# Patient Record
Sex: Female | Born: 1990 | Race: White | Hispanic: No | Smoking: Former smoker
Health system: Southern US, Community
[De-identification: ages and names within clinical notes are randomized; demographics above are authoritative.]

## PROBLEM LIST (undated history)

## (undated) ENCOUNTER — Inpatient Hospital Stay (HOSPITAL_COMMUNITY): Payer: Self-pay

## (undated) DIAGNOSIS — F319 Bipolar disorder, unspecified: Secondary | ICD-10-CM

## (undated) DIAGNOSIS — M419 Scoliosis, unspecified: Secondary | ICD-10-CM

## (undated) DIAGNOSIS — Z8619 Personal history of other infectious and parasitic diseases: Secondary | ICD-10-CM

## (undated) HISTORY — DX: Personal history of other infectious and parasitic diseases: Z86.19

## (undated) HISTORY — PX: FOOT SURGERY: SHX648

---
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
## 2010-06-26 LAB — CBC
HCT: 41.7 % (ref 36.0–46.0)
MCHC: 33.7 g/dL (ref 30.0–36.0)
MCV: 90.6 fL (ref 78.0–100.0)
Platelets: 253 10*3/uL (ref 150–400)
RBC: 4.6 MIL/uL (ref 3.87–5.11)
RDW: 13.3 % (ref 11.5–15.5)

## 2010-06-26 LAB — URINALYSIS, ROUTINE W REFLEX MICROSCOPIC
Bilirubin Urine: NEGATIVE
Ketones, ur: NEGATIVE mg/dL
Protein, ur: NEGATIVE mg/dL
Urobilinogen, UA: 0.2 mg/dL (ref 0.0–1.0)

## 2010-06-26 LAB — WET PREP, GENITAL: Trich, Wet Prep: NONE SEEN

## 2010-06-26 LAB — URINE MICROSCOPIC-ADD ON

## 2010-09-03 ENCOUNTER — Emergency Department (HOSPITAL_COMMUNITY): Payer: Self-pay

## 2010-09-03 ENCOUNTER — Emergency Department (HOSPITAL_COMMUNITY)
Admission: EM | Admit: 2010-09-03 | Discharge: 2010-09-03 | Disposition: A | Discharge status: Still In Hospital | Payer: Self-pay | Attending: Emergency Medicine | Admitting: Emergency Medicine

## 2010-09-03 DIAGNOSIS — F909 Attention-deficit hyperactivity disorder, unspecified type: Secondary | ICD-10-CM | POA: Insufficient documentation

## 2010-09-03 DIAGNOSIS — F319 Bipolar disorder, unspecified: Secondary | ICD-10-CM | POA: Insufficient documentation

## 2010-09-03 DIAGNOSIS — N898 Other specified noninflammatory disorders of vagina: Secondary | ICD-10-CM | POA: Insufficient documentation

## 2010-09-03 DIAGNOSIS — R109 Unspecified abdominal pain: Secondary | ICD-10-CM | POA: Insufficient documentation

## 2010-09-03 DIAGNOSIS — O021 Missed abortion: Secondary | ICD-10-CM | POA: Insufficient documentation

## 2010-09-03 LAB — CBC
Hemoglobin: 14.4 g/dL (ref 12.0–15.0)
MCHC: 35.9 g/dL (ref 30.0–36.0)
MCV: 86.6 fL (ref 78.0–100.0)
Platelets: 275 10*3/uL (ref 150–400)
RBC: 4.63 MIL/uL (ref 3.87–5.11)
WBC: 16.2 10*3/uL — ABNORMAL HIGH (ref 4.0–10.5)

## 2010-09-03 LAB — DIFFERENTIAL
Basophils Relative: 0 % (ref 0–1)
Eosinophils Absolute: 0.1 10*3/uL (ref 0.0–0.7)
Eosinophils Relative: 1 % (ref 0–5)
Monocytes Relative: 6 % (ref 3–12)

## 2010-09-03 LAB — ABO/RH: ABO/RH(D): O POS

## 2010-09-03 LAB — HCG, QUANTITATIVE, PREGNANCY: hCG, Beta Chain, Quant, S: 4072 m[IU]/mL — ABNORMAL HIGH (ref <5)

## 2010-09-04 ENCOUNTER — Inpatient Hospital Stay (HOSPITAL_COMMUNITY): Payer: Self-pay

## 2010-09-04 ENCOUNTER — Inpatient Hospital Stay (HOSPITAL_COMMUNITY)
Admission: AD | Admit: 2010-09-04 | Discharge: 2010-09-04 | Disposition: A | Discharge status: Home / Self Care | Payer: Self-pay | Source: Other Acute Inpatient Hospital | Attending: Obstetrics & Gynecology | Admitting: Obstetrics & Gynecology

## 2010-09-04 DIAGNOSIS — O039 Complete or unspecified spontaneous abortion without complication: Secondary | ICD-10-CM

## 2010-09-04 LAB — CBC
HCT: 36.9 % (ref 36.0–46.0)
Hemoglobin: 12.9 g/dL (ref 12.0–15.0)
MCH: 30.6 pg (ref 26.0–34.0)
MCHC: 35 g/dL (ref 30.0–36.0)
MCV: 87.4 fL (ref 78.0–100.0)
Platelets: 269 K/uL (ref 150–400)
RBC: 4.22 MIL/uL (ref 3.87–5.11)
RDW: 12.4 % (ref 11.5–15.5)
WBC: 15.9 K/uL — ABNORMAL HIGH (ref 4.0–10.5)

## 2011-01-15 ENCOUNTER — Inpatient Hospital Stay (HOSPITAL_COMMUNITY)
Admission: AD | Admit: 2011-01-15 | Discharge: 2011-01-15 | Disposition: A | Discharge status: Home / Self Care | Payer: Medicaid Other | Source: Ambulatory Visit | Attending: Obstetrics and Gynecology | Admitting: Obstetrics and Gynecology

## 2011-01-15 ENCOUNTER — Encounter (HOSPITAL_COMMUNITY): Payer: Self-pay | Admitting: *Deleted

## 2011-01-15 ENCOUNTER — Inpatient Hospital Stay (HOSPITAL_COMMUNITY): Payer: Medicaid Other

## 2011-01-15 DIAGNOSIS — O210 Mild hyperemesis gravidarum: Secondary | ICD-10-CM

## 2011-01-15 DIAGNOSIS — O211 Hyperemesis gravidarum with metabolic disturbance: Secondary | ICD-10-CM | POA: Insufficient documentation

## 2011-01-15 DIAGNOSIS — E860 Dehydration: Secondary | ICD-10-CM | POA: Insufficient documentation

## 2011-01-15 HISTORY — DX: Scoliosis, unspecified: M41.9

## 2011-01-15 HISTORY — DX: Bipolar disorder, unspecified: F31.9

## 2011-01-15 LAB — URINALYSIS, ROUTINE W REFLEX MICROSCOPIC
Ketones, ur: >80 mg/dL — AB
Protein, ur: NEGATIVE mg/dL

## 2011-01-15 MED ORDER — ONDANSETRON 4 MG PO TBDP
4.0000 mg | ORAL_TABLET | Freq: Once | ORAL | Status: AC
  Administered 2011-01-15: 4 mg via ORAL
  Filled 2011-01-15: qty 1

## 2011-01-15 MED ORDER — ONDANSETRON 8 MG PO TBDP: 8.0000 mg | ORAL_TABLET | Freq: Three times a day (TID) | ORAL | Status: AC | PRN

## 2011-01-15 MED ORDER — LACTATED RINGERS IV BOLUS (SEPSIS)
1000.0000 mL | Freq: Once | INTRAVENOUS | Status: AC
  Administered 2011-01-15: 1000 mL via INTRAVENOUS

## 2011-01-15 MED ORDER — LACTATED RINGERS IV BOLUS (SEPSIS): 1000.0000 mL | Freq: Once | INTRAVENOUS | Status: DC

## 2011-01-15 MED ORDER — PROMETHAZINE HCL 25 MG/ML IJ SOLN
25.0000 mg | INTRAVENOUS | Status: DC
  Administered 2011-01-15: 25 mg via INTRAVENOUS
  Filled 2011-01-15: qty 1

## 2011-01-15 MED ORDER — PROMETHAZINE HCL 25 MG PO TABS: 25.0000 mg | ORAL_TABLET | Freq: Four times a day (QID) | ORAL | Status: DC | PRN

## 2011-01-15 NOTE — ED Provider Notes (Signed)
History     Chief Complaint  Patient presents with  . Possible Pregnancy   HPI  Pt is pregnant and is complaining of vaginal spotting 9/6.  She presents with vomiting intermittently more so today.  She has not had anything to eat or drink today.   Care assumed from Brooke Marquez, CNM  Past Medical History  Diagnosis Date  . Bipolar affective   . Scoliosis     Past Surgical History  Procedure Date  . Foot surgery     No family history on file.  History  Substance Use Topics  . Smoking status: Former Smoker -- 1.0 packs/day    Quit date: 01/09/2011  . Smokeless tobacco: Former Neurosurgeon    Quit date: 01/09/2011  . Alcohol Use: No    Allergies:  Allergies  Allergen Reactions  . Codeine Hives    No prescriptions prior to admission    ROS Physical Exam   Blood pressure 123/64, pulse 56, temperature 98.5 F (36.9 C), temperature source Oral, resp. rate 16, height 5\' 3"  (1.6 m), weight 161 lb 12.8 oz (73.392 kg), last menstrual period 11/10/2010, SpO2 99.00%.  Physical Exam  MAU Course  Procedures Yolk sac: Present  Embryo: Present  Cardiac Activity: Present  Heart Rate: 106 bpm  CRL: 7 mm 6w 5d [place] EDC: 09/05/2011  Maternal uterus/Adnexae:  Small subchorionic hemorrhage.  No free pelvic fluid.  Right ovary normal size, 2.8 x 2.8 x 3.6 cm, containing a corpus  luteum.  Left ovary normal size and morphology, 2.3 x 2.1 x 1.7 cm.  No adnexal masses.  IMPRESSION:  Single live early intrauterine gestation measured at 6 weeks 5 days  EGA by crown-rump length.  Small subchorionic hemorrhage.  Original Report Authenticated By: Brooke Marquez, M.D.  Pt was given IVF for hydration with IV Phenergan and Zofran ODT- pt was able to tolerate PO fluids and crackers after treatment   Assessment and Plan  Hyperemesis in pregnancy with dehydration   Brooke Marquez 01/15/2011, 6:14 PM

## 2011-01-15 NOTE — Progress Notes (Signed)
Pt states has been vomiting intermittently, unable to hold food down today, but is tolerating fluids.  Pt is bipolar with no med tx, states has been more moody than usual during the pregnancy.  Desires PNC at [facility], closer to her home than [facility] where she has an appt next week.  Pt requesting u/s.  Denies vag bleeding or d/c, but c/o intermittent cramping.

## 2011-01-15 NOTE — ED Provider Notes (Signed)
Brooke Marquez [id number] @Unknown  Chief Complaint  Patient presents with  . Possible Pregnancy    SUBJECTIVE  HPI: Presents with 2-3 week history of nausea and vomiting. States she's retain nothing but sips of fluids for the last couple of days. Feels weak and a little dizzy when standing and walking. Last normal menstrual period was the end of July 2 weeks later she had slight bleeding for one to 2 days in the middle of August. She has some abdominal soreness associated with vomiting and some mild lower abdominal cramping discomfort. No vaginal bleeding.  Past Medical History  Diagnosis Date  . Bipolar affective   . Scoliosis    Ob Hx: SAB x 3  Past Surgical History  Procedure Date  . Foot surgery    History   Social History  . Marital Status: Married    Spouse Name: N/A    Number of Children: N/A  . Years of Education: N/A   Occupational History  . Not on file.   Social History Main Topics  . Smoking status: Former Smoker -- 1.0 packs/day    Quit date: 01/09/2011  . Smokeless tobacco: Former Neurosurgeon    Quit date: 01/09/2011  . Alcohol Use: No  . Drug Use: No  . Sexually Active: Yes   Other Topics Concern  . Not on file   Social History Narrative  . No narrative on file   No current facility-administered medications on file prior to encounter.   No current outpatient prescriptions on file prior to encounter.   Allergies  Allergen Reactions  . Codeine Hives    ROS: Pertinent items in HPI  OBJECTIVE  BP 123/64  Pulse 56  Temp(Src) 98.5 F (36.9 C) (Oral)  Resp 16  Ht 5\' 3"  (1.6 m)  Wt 73.392 kg (161 lb 12.8 oz)  BMI 28.66 kg/m2  SpO2 99%  LMP 11/10/2010   Physical Exam:  General: Looks fatigued. Skin: Sl dry Abd: Soft, NT Pelvic: declined/deferred Back: Neg CVAT Ext: No edema   MAU course: Felt better after IV rehydration and antiemetics. Able to retain crackers. Results for orders placed during the hospital encounter of 01/15/11 (from  the past 24 hour(s))  URINALYSIS, ROUTINE W REFLEX MICROSCOPIC     Status: Abnormal   Collection Time   01/15/11 10:25 AM      Component Value Range   Color, Urine YELLOW  YELLOW    Appearance CLEAR  CLEAR    Specific Gravity, Urine 1.025  1.005 - 1.030    pH 6.0  5.0 - 8.0    Glucose, UA NEGATIVE  NEGATIVE (mg/dL)   Hgb urine dipstick NEGATIVE  NEGATIVE    Bilirubin Urine SMALL (*) NEGATIVE    Ketones, ur >80 (*) NEGATIVE (mg/dL)   Protein, ur NEGATIVE  NEGATIVE (mg/dL)   Urobilinogen, UA 1.0  0.0 - 1.0 (mg/dL)   Nitrite NEGATIVE  NEGATIVE    Leukocytes, UA NEGATIVE  NEGATIVE   POCT PREGNANCY, URINE     Status: Normal   Collection Time   01/15/11 10:28 AM      Component Value Range   Preg Test, Ur POSITIVE     MAU course: Vomited several times after arrival in MAU. IVF and antiemetics given with improvement in sx. Retaining crackers ASSESSMENT  Nausea and vomiting of pregnancy Dehydration   PLAN Rx Phenergan and Zofran Care assumed by Henrietta Hoover, PA. Pt in Ultrasound Dept.

## 2011-01-15 NOTE — Progress Notes (Signed)
Pt states she has had a POS HPT. Has had some nausea and vomiting for several weeks, was bad 10-10. Had irregular periods in July and the August period was early. Wants to know how far she is. Pt states she has a history of three miscarriages and is concerned. Has her first appointment next week.

## 2011-01-26 NOTE — ED Provider Notes (Signed)
Chart reviewed and agree with management and plan.  

## 2011-02-03 LAB — OB RESULTS CONSOLE ABO/RH: RH Type: POSITIVE

## 2011-02-03 LAB — OB RESULTS CONSOLE RPR: RPR: NONREACTIVE

## 2011-02-03 LAB — OB RESULTS CONSOLE GC/CHLAMYDIA: Gonorrhea: NEGATIVE

## 2011-04-07 NOTE — L&D Delivery Note (Signed)
Delivery Note At 8:16 PM a viable female was delivered via  (Presentation:LOA VTX ;  ).  APGAR:8/9 , ; weight .   Placenta status:INTACT , .  Cord:3 VESSEL  with the following complications:NONE .  Cord pH: NA  Anesthesia: Epidural  Episiotomy: NONE Lacerations: FIRST DEGREE Suture Repair: 2.0 chromic Est. Blood Loss (mL): 300 CC  Mom to postpartum.  Baby to nursery-stable.  [name] S 09/10/2011, 8:29 PM

## 2011-08-04 LAB — OB RESULTS CONSOLE GBS: GBS: NEGATIVE

## 2011-08-26 ENCOUNTER — Inpatient Hospital Stay (HOSPITAL_COMMUNITY)
Admission: AD | Admit: 2011-08-26 | Discharge: 2011-08-26 | Disposition: A | Discharge status: Home / Self Care | Payer: Medicaid Other | Source: Ambulatory Visit | Attending: Obstetrics and Gynecology | Admitting: Obstetrics and Gynecology

## 2011-08-26 ENCOUNTER — Encounter (HOSPITAL_COMMUNITY): Payer: Self-pay | Admitting: *Deleted

## 2011-08-26 DIAGNOSIS — O479 False labor, unspecified: Secondary | ICD-10-CM | POA: Insufficient documentation

## 2011-08-26 NOTE — MAU Note (Signed)
PT SAYS  HURT BAD  AT 0330.   VE IN OFFFICE - .5 CM.   DENIES HSV AND MRSA.

## 2011-09-07 ENCOUNTER — Encounter (HOSPITAL_COMMUNITY): Payer: Self-pay | Admitting: *Deleted

## 2011-09-07 ENCOUNTER — Telehealth (HOSPITAL_COMMUNITY): Payer: Self-pay | Admitting: *Deleted

## 2011-09-07 NOTE — Telephone Encounter (Signed)
Preadmission screen  

## 2011-09-09 ENCOUNTER — Inpatient Hospital Stay (HOSPITAL_COMMUNITY)
Admission: RE | Admit: 2011-09-09 | Discharge: 2011-09-12 | DRG: 775 | Disposition: A | Discharge status: Home / Self Care | Payer: Medicaid Other | Source: Ambulatory Visit | Attending: Obstetrics and Gynecology | Admitting: Obstetrics and Gynecology

## 2011-09-09 LAB — CBC
HCT: 36 % (ref 36.0–46.0)
Hemoglobin: 12.5 g/dL (ref 12.0–15.0)
MCH: 30.6 pg (ref 26.0–34.0)
MCHC: 34.7 g/dL (ref 30.0–36.0)
MCV: 88 fL (ref 78.0–100.0)
Platelets: 219 K/uL (ref 150–400)
RBC: 4.09 MIL/uL (ref 3.87–5.11)
RDW: 13.2 % (ref 11.5–15.5)
WBC: 12.3 K/uL — ABNORMAL HIGH (ref 4.0–10.5)

## 2011-09-09 MED ORDER — LIDOCAINE HCL (PF) 1 % IJ SOLN
30.0000 mL | INTRAMUSCULAR | Status: DC | PRN
  Administered 2011-09-10: 30 mL via SUBCUTANEOUS
  Filled 2011-09-09: qty 30

## 2011-09-09 MED ORDER — LACTATED RINGERS IV SOLN
INTRAVENOUS | Status: DC
  Administered 2011-09-09: 21:00:00 via INTRAVENOUS
  Administered 2011-09-10: 125 mL/h via INTRAVENOUS

## 2011-09-09 MED ORDER — TERBUTALINE SULFATE 1 MG/ML IJ SOLN: 0.2500 mg | Freq: Once | INTRAMUSCULAR | Status: AC | PRN

## 2011-09-09 MED ORDER — OXYTOCIN 20 UNITS IN LACTATED RINGERS INFUSION - SIMPLE
1.0000 m[IU]/min | INTRAVENOUS | Status: DC
  Administered 2011-09-10: 2 m[IU]/min via INTRAVENOUS
  Filled 2011-09-09: qty 1000

## 2011-09-09 MED ORDER — LACTATED RINGERS IV SOLN
500.0000 mL | INTRAVENOUS | Status: DC | PRN
  Administered 2011-09-10 (×2): 500 mL via INTRAVENOUS

## 2011-09-09 MED ORDER — CITRIC ACID-SODIUM CITRATE 334-500 MG/5ML PO SOLN: 30.0000 mL | ORAL | Status: DC | PRN

## 2011-09-09 MED ORDER — MISOPROSTOL 25 MCG QUARTER TABLET
25.0000 ug | ORAL_TABLET | ORAL | Status: AC | PRN
  Administered 2011-09-09 – 2011-09-10 (×2): 25 ug via VAGINAL
  Filled 2011-09-09 (×2): qty 0.25

## 2011-09-09 MED ORDER — ACETAMINOPHEN 325 MG PO TABS: 650.0000 mg | ORAL_TABLET | ORAL | Status: DC | PRN

## 2011-09-09 MED ORDER — ZOLPIDEM TARTRATE 10 MG PO TABS
10.0000 mg | ORAL_TABLET | Freq: Every evening | ORAL | Status: DC | PRN
  Administered 2011-09-09: 10 mg via ORAL
  Filled 2011-09-09: qty 1

## 2011-09-09 MED ORDER — ONDANSETRON HCL 4 MG/2ML IJ SOLN: 4.0000 mg | Freq: Four times a day (QID) | INTRAMUSCULAR | Status: DC | PRN

## 2011-09-09 MED ORDER — FLEET ENEMA 7-19 GM/118ML RE ENEM: 1.0000 | ENEMA | RECTAL | Status: DC | PRN

## 2011-09-09 MED ORDER — OXYTOCIN 20 UNITS IN LACTATED RINGERS INFUSION - SIMPLE
125.0000 mL/h | Freq: Once | INTRAVENOUS | Status: AC
  Administered 2011-09-10: 125 mL/h via INTRAVENOUS

## 2011-09-09 MED ORDER — IBUPROFEN 600 MG PO TABS
600.0000 mg | ORAL_TABLET | Freq: Four times a day (QID) | ORAL | Status: DC | PRN
  Administered 2011-09-10: 600 mg via ORAL
  Filled 2011-09-09: qty 1

## 2011-09-09 MED ORDER — OXYTOCIN BOLUS FROM INFUSION
500.0000 mL | Freq: Once | INTRAVENOUS | Status: DC
  Filled 2011-09-09: qty 500

## 2011-09-10 ENCOUNTER — Encounter (HOSPITAL_COMMUNITY): Payer: Self-pay | Admitting: Anesthesiology

## 2011-09-10 ENCOUNTER — Inpatient Hospital Stay (HOSPITAL_COMMUNITY): Payer: Medicaid Other | Admitting: Anesthesiology

## 2011-09-10 ENCOUNTER — Encounter (HOSPITAL_COMMUNITY): Payer: Self-pay

## 2011-09-10 MED ORDER — ONDANSETRON HCL 4 MG/2ML IJ SOLN: 4.0000 mg | INTRAMUSCULAR | Status: DC | PRN

## 2011-09-10 MED ORDER — FENTANYL 2.5 MCG/ML BUPIVACAINE 1/10 % EPIDURAL INFUSION (WH - ANES)
INTRAMUSCULAR | Status: DC | PRN
  Administered 2011-09-10: 14 mL/h via EPIDURAL

## 2011-09-10 MED ORDER — PHENYLEPHRINE 40 MCG/ML (10ML) SYRINGE FOR IV PUSH (FOR BLOOD PRESSURE SUPPORT)
80.0000 ug | PREFILLED_SYRINGE | INTRAVENOUS | Status: DC | PRN
  Filled 2011-09-10: qty 5

## 2011-09-10 MED ORDER — SIMETHICONE 80 MG PO CHEW: 80.0000 mg | CHEWABLE_TABLET | ORAL | Status: DC | PRN

## 2011-09-10 MED ORDER — BISACODYL 10 MG RE SUPP: 10.0000 mg | Freq: Every day | RECTAL | Status: DC | PRN

## 2011-09-10 MED ORDER — FENTANYL 2.5 MCG/ML BUPIVACAINE 1/10 % EPIDURAL INFUSION (WH - ANES)
14.0000 mL/h | INTRAMUSCULAR | Status: DC
  Administered 2011-09-10 (×2): 14 mL/h via EPIDURAL
  Filled 2011-09-10 (×3): qty 60

## 2011-09-10 MED ORDER — FLEET ENEMA 7-19 GM/118ML RE ENEM: 1.0000 | ENEMA | Freq: Every day | RECTAL | Status: DC | PRN

## 2011-09-10 MED ORDER — WITCH HAZEL-GLYCERIN EX PADS: 1.0000 "application " | MEDICATED_PAD | CUTANEOUS | Status: DC | PRN

## 2011-09-10 MED ORDER — EPHEDRINE 5 MG/ML INJ: 10.0000 mg | INTRAVENOUS | Status: DC | PRN

## 2011-09-10 MED ORDER — DIBUCAINE 1 % RE OINT: 1.0000 "application " | TOPICAL_OINTMENT | RECTAL | Status: DC | PRN

## 2011-09-10 MED ORDER — DIPHENHYDRAMINE HCL 50 MG/ML IJ SOLN: 12.5000 mg | INTRAMUSCULAR | Status: DC | PRN

## 2011-09-10 MED ORDER — OXYCODONE-ACETAMINOPHEN 5-325 MG PO TABS: 1.0000 | ORAL_TABLET | ORAL | Status: DC | PRN

## 2011-09-10 MED ORDER — ONDANSETRON HCL 4 MG PO TABS: 4.0000 mg | ORAL_TABLET | ORAL | Status: DC | PRN

## 2011-09-10 MED ORDER — SODIUM BICARBONATE 8.4 % IV SOLN
INTRAVENOUS | Status: DC | PRN
  Administered 2011-09-10: 4 mL via EPIDURAL

## 2011-09-10 MED ORDER — BENZOCAINE-MENTHOL 20-0.5 % EX AERO: 1.0000 "application " | INHALATION_SPRAY | CUTANEOUS | Status: DC | PRN

## 2011-09-10 MED ORDER — PRENATAL MULTIVITAMIN CH
1.0000 | ORAL_TABLET | Freq: Every day | ORAL | Status: DC
  Administered 2011-09-11: 1 via ORAL
  Filled 2011-09-10 (×2): qty 1

## 2011-09-10 MED ORDER — IBUPROFEN 600 MG PO TABS
600.0000 mg | ORAL_TABLET | Freq: Four times a day (QID) | ORAL | Status: DC
  Administered 2011-09-11 – 2011-09-12 (×6): 600 mg via ORAL
  Filled 2011-09-10 (×6): qty 1

## 2011-09-10 MED ORDER — LACTATED RINGERS IV SOLN
500.0000 mL | Freq: Once | INTRAVENOUS | Status: AC
  Administered 2011-09-10: 500 mL via INTRAVENOUS

## 2011-09-10 MED ORDER — LANOLIN HYDROUS EX OINT: TOPICAL_OINTMENT | CUTANEOUS | Status: DC | PRN

## 2011-09-10 MED ORDER — EPHEDRINE 5 MG/ML INJ
10.0000 mg | INTRAVENOUS | Status: DC | PRN
  Filled 2011-09-10: qty 4

## 2011-09-10 MED ORDER — SENNOSIDES-DOCUSATE SODIUM 8.6-50 MG PO TABS
2.0000 | ORAL_TABLET | Freq: Every day | ORAL | Status: DC
  Administered 2011-09-11: 2 via ORAL

## 2011-09-10 MED ORDER — PHENYLEPHRINE 40 MCG/ML (10ML) SYRINGE FOR IV PUSH (FOR BLOOD PRESSURE SUPPORT): 80.0000 ug | PREFILLED_SYRINGE | INTRAVENOUS | Status: DC | PRN

## 2011-09-10 MED ORDER — TETANUS-DIPHTH-ACELL PERTUSSIS 5-2.5-18.5 LF-MCG/0.5 IM SUSP
0.5000 mL | Freq: Once | INTRAMUSCULAR | Status: AC
  Administered 2011-09-11: 0.5 mL via INTRAMUSCULAR
  Filled 2011-09-10: qty 0.5

## 2011-09-10 MED ORDER — ZOLPIDEM TARTRATE 5 MG PO TABS: 5.0000 mg | ORAL_TABLET | Freq: Every evening | ORAL | Status: DC | PRN

## 2011-09-10 MED ORDER — DIPHENHYDRAMINE HCL 25 MG PO CAPS: 25.0000 mg | ORAL_CAPSULE | Freq: Four times a day (QID) | ORAL | Status: DC | PRN

## 2011-09-10 NOTE — Progress Notes (Signed)
Patient ID: Brooke Marquez, female   DOB: [birth year], 21 y.o.   MRN: [id number] On 5 mu of pitocin On oxygen due to few fetal decels Now reactive with no decels Cervix  7 cm 100%  vtx at 0 station

## 2011-09-10 NOTE — H&P (Signed)
Brooke Marquez is a 21 y.o. female presenting for induciton at 40.5.  Neg GBS. Maternal Medical History:  Prenatal Complications - Diabetes: none.    OB History    Grav Para Term Preterm Abortions TAB SAB Ect Mult Living   3 0   2  2        Past Medical History  Diagnosis Date  . Bipolar affective   . Scoliosis   . History of chicken pox   . Asthma     rare inhaler, exercise induced   Past Surgical History  Procedure Date  . Foot surgery    Family History: family history includes Cancer in her mother and Hypertension in her mother.  She is adopted. Social History:  reports that she quit smoking about 8 months ago. She has never used smokeless tobacco. She reports that she does not drink alcohol or use illicit drugs.  ROS  Dilation: 1.5 Effacement (%): 70 Station: -2 Exam by:: Helane Gunther RN Blood pressure 137/78, pulse 80, temperature 97.6 F (36.4 C), temperature source Oral, resp. rate 20, last menstrual period 11/10/2010. Maternal Exam:  Uterine Assessment: Contraction strength is mild.  Contraction frequency is irregular.   Abdomen: Patient reports no abdominal tenderness. Fundal height is c/w dates.   Estimated fetal weight is 7.5.   Fetal presentation: vertex  Pelvis: adequate for delivery.   Cervix: Cervix evaluated by digital exam.     Physical Exam  Prenatal labs: ABO, Rh: O/Positive/-- (10/30 0000) Antibody: Negative (10/30 0000) Rubella: Immune (10/30 0000) RPR: NON REACTIVE (06/05 2040)  HBsAg: Negative (10/30 0000)  HIV: Non-reactive (10/30 0000)  GBS: Negative (04/30 0000)   Assessment/Plan: IUP at 40.5.  Cytotec last pm.  Begin pitocin risks discussed.   Brooke Marquez 09/10/2011, 7:25 AM

## 2011-09-10 NOTE — Anesthesia Procedure Notes (Signed)
Epidural Patient location during procedure: OB  Preanesthetic Checklist Completed: patient identified, site marked, surgical consent, pre-op evaluation, timeout performed, IV checked, risks and benefits discussed and monitors and equipment checked  Epidural Patient position: sitting Prep: site prepped and draped and DuraPrep Patient monitoring: continuous pulse ox and blood pressure Approach: midline Injection technique: LOR air  Needle:  Needle type: Tuohy  Needle gauge: 17 G Needle length: 9 cm Needle insertion depth: 7 cm Catheter type: closed end flexible Catheter size: 19 Gauge Catheter at skin depth: 14 cm Test dose: negative  Assessment Events: blood not aspirated, injection not painful, no injection resistance, negative IV test and no paresthesia  Additional Notes Dosing of Epidural:  1st dose, through needle ............................................. epi 1:200K + Xylocaine 40 mg  2nd dose, through catheter, after waiting 3 minutes.....epi 1:200K + Xylocaine 40 mg  3rd dose, through catheter after waiting 3 minutes .............................Marcaine   4mg   ( mg Marcaine are expressed as equivilent  cc's medication removed from the 0.1%Bupiv / fentanyl syringe from L&D pump)  ( 2% Xylo charted as a single dose in Epic Meds for ease of charting; actual dosing was fractionated as above, for saftey's sake)  As each dose occurred, patient was free of IV sx; and patient exhibited no evidence of SA injection.  Patient is more comfortable after epidural dosed. Please see RN's note for documentation of vital signs,and FHR which are stable.  Patient reminded not to try to ambulate with numb legs, and that an RN must be present the 1st time she attempts to get up.    

## 2011-09-10 NOTE — Anesthesia Preprocedure Evaluation (Signed)
Anesthesia Evaluation    Airway       Dental   Pulmonary asthma ,          Cardiovascular     Neuro/Psych    GI/Hepatic   Endo/Other  Morbid obesity  Renal/GU      Musculoskeletal   Abdominal   Peds  Hematology   Anesthesia Other Findings   Reproductive/Obstetrics                           Anesthesia Physical Anesthesia Plan  ASA: III  Anesthesia Plan: Epidural   Post-op Pain Management:    Induction:   Airway Management Planned:   Additional Equipment:   Intra-op Plan:   Post-operative Plan:   Informed Consent: I have reviewed the patients History and Physical, chart, labs and discussed the procedure including the risks, benefits and alternatives for the proposed anesthesia with the patient or authorized representative who has indicated his/her understanding and acceptance.   Dental Advisory Given  Plan Discussed with:   Anesthesia Plan Comments: (Labs checked- platelets confirmed with RN in room. Fetal heart tracing, per RN, reported to be stable enough for sitting procedure. Discussed epidural, and patient consents to the procedure:  included risk of possible headache,backache, failed block, allergic reaction, and nerve injury. This patient was asked if she had any questions or concerns before the procedure started. )        Anesthesia Quick Evaluation

## 2011-09-11 LAB — CBC
HCT: 32 % — ABNORMAL LOW (ref 36.0–46.0)
RBC: 3.6 MIL/uL — ABNORMAL LOW (ref 3.87–5.11)
RDW: 13.5 % (ref 11.5–15.5)
WBC: 18 10*3/uL — ABNORMAL HIGH (ref 4.0–10.5)

## 2011-09-11 NOTE — Anesthesia Postprocedure Evaluation (Signed)
  Anesthesia Post-op Note  Patient: Brooke Marquez  Procedure(s) Performed: * No surgery found *  Patient Location: Mother/Baby  Anesthesia Type: Epidural  Level of Consciousness: awake  Airway and Oxygen Therapy: Patient Spontanous Breathing  Post-op Pain: none  Post-op Assessment: Patient's Cardiovascular Status Stable, Respiratory Function Stable, Patent Airway, No signs of Nausea or vomiting, Adequate PO intake, Pain level controlled, No headache, No backache, No residual numbness and No residual motor weakness  Post-op Vital Signs: Reviewed and stable  Complications: No apparent anesthesia complications

## 2011-09-11 NOTE — Progress Notes (Signed)
UR Chart review completed.  

## 2011-09-11 NOTE — Progress Notes (Signed)
SW attempted to meet with MOB to complete assessment for consult of Bipolar dx, but she requested that SW come back at a later time when FOB and MGM are not present.  SW asked when a better time would be and she asked if she could call SW.  SW gave her SW's phone number and will check back later this afternoon or in the morning if she has not called by then.

## 2011-09-11 NOTE — Progress Notes (Signed)
Post Partum Day 1 Subjective: no complaints  Objective: Blood pressure 114/54, pulse 83, temperature 98.1 F (36.7 C), temperature source Oral, resp. rate 16, height 5\' 1"  (1.549 m), weight 91.173 kg (201 lb), last menstrual period 11/10/2010, SpO2 98.00%, unknown if currently breastfeeding.  Physical Exam:  General: alert, cooperative and no distress Lochia: appropriate Uterine Fundus: firm Incision: healing well DVT Evaluation: No evidence of DVT seen on physical exam.   Basename 09/11/11 0534 09/09/11 2040  HGB 10.7* 12.5  HCT 32.0* 36.0    Assessment/Plan: Plan for discharge tomorrow   LOS: 2 days   Sergi Gellner II,Sarim Rothman E 09/11/2011, 8:19 AM

## 2011-09-12 MED ORDER — IBUPROFEN 600 MG PO TABS: 600.0000 mg | ORAL_TABLET | Freq: Four times a day (QID) | ORAL | Status: AC | PRN

## 2011-09-12 MED ORDER — OXYCODONE-ACETAMINOPHEN 5-325 MG PO TABS: 1.0000 | ORAL_TABLET | Freq: Four times a day (QID) | ORAL | Status: AC | PRN

## 2011-09-12 NOTE — Progress Notes (Signed)
PSYCHOSOCIAL ASSESSMENT ~ MATERNAL/CHILD Name:  Brooke Marquez      Age: 21 days    Referral Date: 09/09/2016   Reason/Source:Hx of bipolar/CN I. FAMILY/HOME ENVIRONMENT A. Child's Legal Guardian Parent(s)    Name:  Mily Malecki  DOB: 06-Feb-1991    Age:  75 Address:  [address], [place], [place] [address] Name:  [place] Address:  Different residence in [place], [place] B. Other Household Members/Support Persons Name:  Barbette Mcglaun, Adoptive mother                 Adoptive father           C.   Other Support: MOB's biological mother and father(baby's maternal grandparents) II. PSYCHOSOCIAL DATA A. Information Source X Patient Interview   B. 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 Cultural and Environment Information/Cultural Issues Impacting Care:  N/A III. STRENGTHS X Supportive family/friends   X Adequate Resources  X Home prepared for Child (including basic supplies)                 X Other- [facility] in [place] IV. RISK FACTORS AND CURRENT PROBLEMS         History of maternal behavioral health   Family/Relationship Issues-currently separated from FOB                        V. SOCIAL WORK ASSESSMENT  Met with MOB and infant at bedside for assessment.  MOB reports that she has been separated from FOB since she was 3 months pregnant.  She reported relationship stress and difficulty getting along with FOB as the reason for leaving FOB.  MOB lives with her adoptive parents.  They refinished the basement in the house, and MOB and baby will be staying in the basement apartment.  MOB worked for a few months at [employer] in [place] and she has been on unpaid Family Leave since 08/05/2011.  She hopes to return to work in a few months.  She plans to look into onsite childcare at the [facility] so she can be close to her baby while working.    MOB reports that her biological  father Gilford Raid grandfather) lives a few blocks from MOB residence.   Biological mother (maternal grandmother) also lives in the next city over and stays connected to [facility].  MOB reconnected with her biological parents when she was around 38 years old.  She reports the reunion as positive, rewarding and fulfilling.  She reports maternal grandmother was present for the baby's birth.  Maternal grandfather has been present throughout MOB's hospital course.  Adoptive parents will be picking MOB and baby up today for discharge. Maternal grandfather is so excited to be a first time grandparent and looks forward to spending time with his grandchild and daughter during this special time.   MOB reports some stress and depression during the pregnancy due to the marital separation.  She did not wish to talk about it much during her pregnancy as it was upsetting to her.  She also did not wish to take medications.  She reports that as delivery neared, she felt much more optimistic and hopeful.  She now reports her mood as upbeat and positive.  She feels she has adjusted well to moving on without being married to FOB, though she reports  he will be a part of child's life.  She feels very good about having support from her adoptive parents and biological parents, who have all been present during MOB's stay.  She does not endorse current feelings of depression and worry.  She was WD, WN and in no distress.  She regards her baby warmly, and exhibited good bonding and technical skills in changing diapers, holding baby, and dressing baby.  She is very happy to be a MOB.  She reports being able to access outpatient services if she needs it in the future.  MOB did not express any concerns or needs at this time.     VI. SOCIAL WORK PLAN X No Further Intervention Required/No Barriers to Discharge X Patient/Family Education:  Feelings After Birth brochure  Staci Acosta, MSW LCSW, 09/12/2011, 11:00 am

## 2011-09-12 NOTE — Progress Notes (Signed)
Post Partum Day 2 Subjective: no complaints, up ad lib, voiding, tolerating PO and + flatus  Objective: Blood pressure 137/74, pulse 86, temperature 97.3 F (36.3 C), temperature source Oral, resp. rate 18, height 5\' 1"  (1.549 m), weight 91.173 kg (201 lb), last menstrual period 11/10/2010, SpO2 98.00%, unknown if currently breastfeeding.  Physical Exam:  General: alert, cooperative and no distress Lochia: appropriate Uterine Fundus: firm Incision: healing well DVT Evaluation: No evidence of DVT seen on physical exam.   Basename 09/11/11 0534 09/09/11 2040  HGB 10.7* 12.5  HCT 32.0* 36.0    Assessment/Plan: Discharge home   LOS: 3 days   Ladora Osterberg II,Aaryan Essman E 09/12/2011, 7:10 AM

## 2011-09-12 NOTE — Discharge Summary (Signed)
Obstetric Discharge Summary Reason for Admission: induction of labor Prenatal Procedures: ultrasound Intrapartum Procedures: spontaneous vaginal delivery Postpartum Procedures: none Complications-Operative and Postpartum: none Hemoglobin  Date Value Range Status  09/11/2011 10.7* 12.0-15.0 (g/dL) Final     HCT  Date Value Range Status  09/11/2011 32.0* 36.0-46.0 (%) Final    Physical Exam:  General: alert, cooperative and no distress Lochia: appropriate Uterine Fundus: firm Incision: healing well DVT Evaluation: No evidence of DVT seen on physical exam.  Discharge Diagnoses: Term Pregnancy-delivered  Discharge Information: Date: 09/12/2011 Activity: pelvic rest Diet: routine Medications: PNV, Ibuprofen and Percocet Condition: stable Instructions: refer to practice specific booklet Discharge to: home   Newborn Data: Live born female  Birth Weight: 7 lb 5.5 oz (3330 g) APGAR: 9, 9  Home with mother.  Illianna Paschal II,Maizie Garno E 09/12/2011, 7:11 AM

## 2011-09-12 NOTE — Progress Notes (Signed)
SW referral received to follow up with patient.  Patient currently with Lactation.  Will attempt follow up prior to discharge.  Staci Acosta, MSW LCSW 09/12/2011, 10:00 am

## 2013-02-28 IMAGING — US US OB TRANSVAGINAL
1 series · 13 of 20 positions shown · non-contrast
Comparison: 09/03/2010

CLINICAL DATA: Spontaneous abortion

TRANSVAGINAL OB ULTRASOUND
TECHNIQUE: Transvaginal ultrasound was performed for evaluation of
the gestation as well as the maternal uterus and adnexal regions.

[Series 1: us ob transvaginal · 13 of 20 slices shown]
[im 1/20]
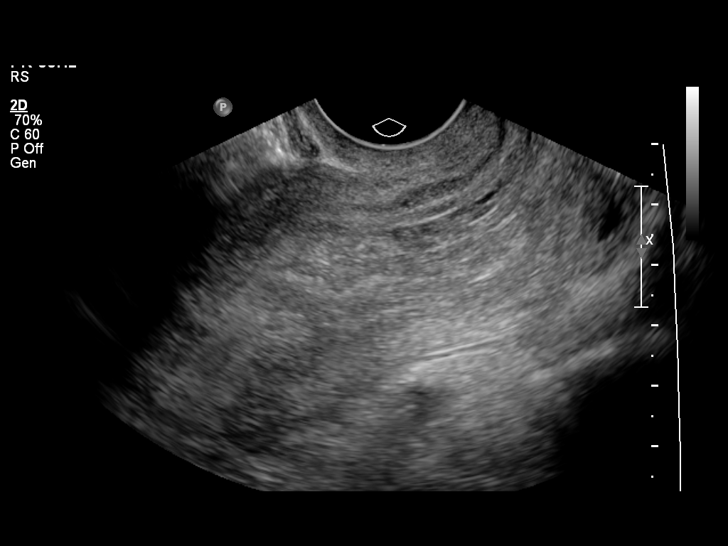
[im 3/20]
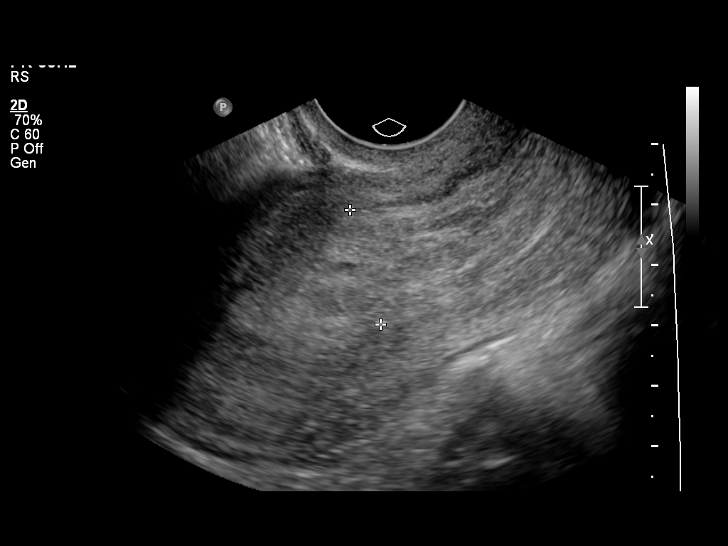
[im 4/20]
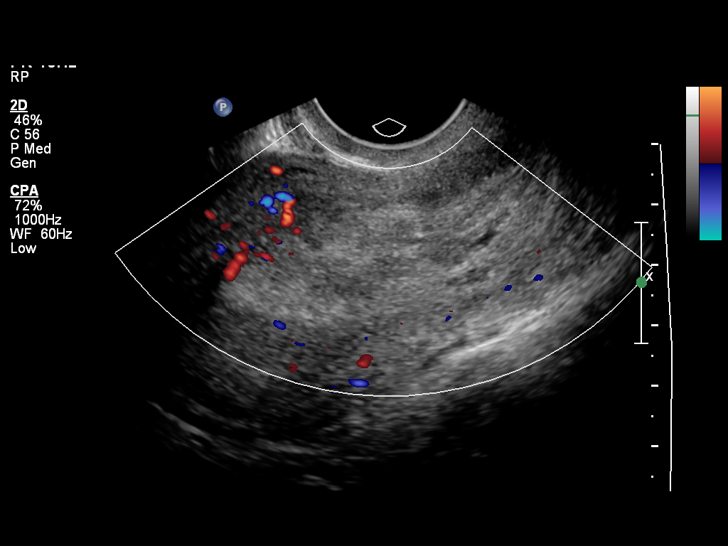
[im 6/20]
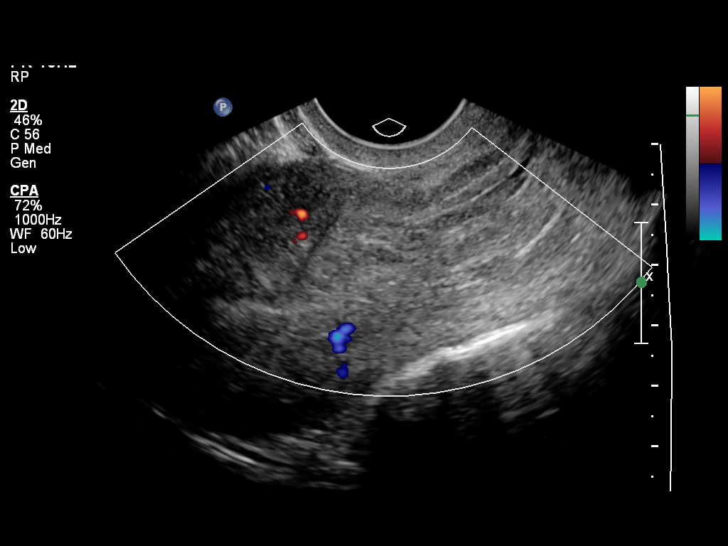
[im 7/20]
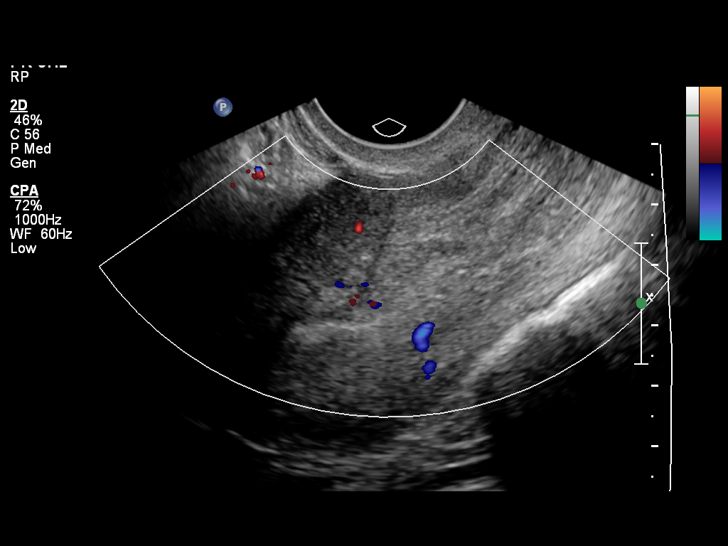
[im 9/20]
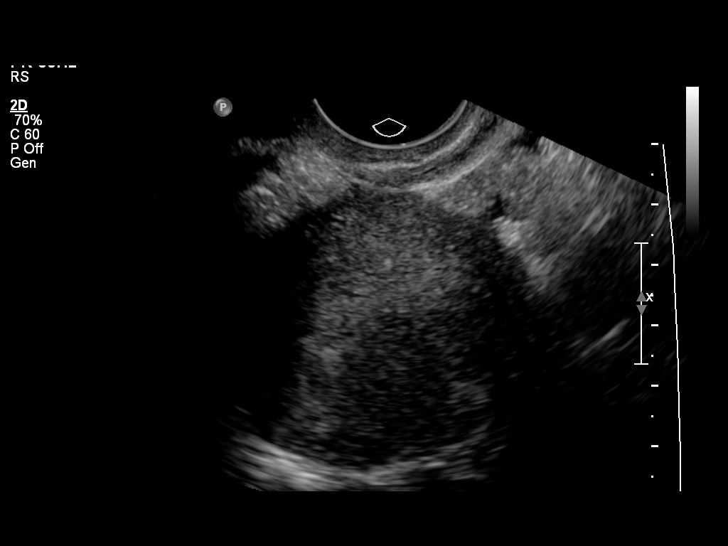
[im 11/20]
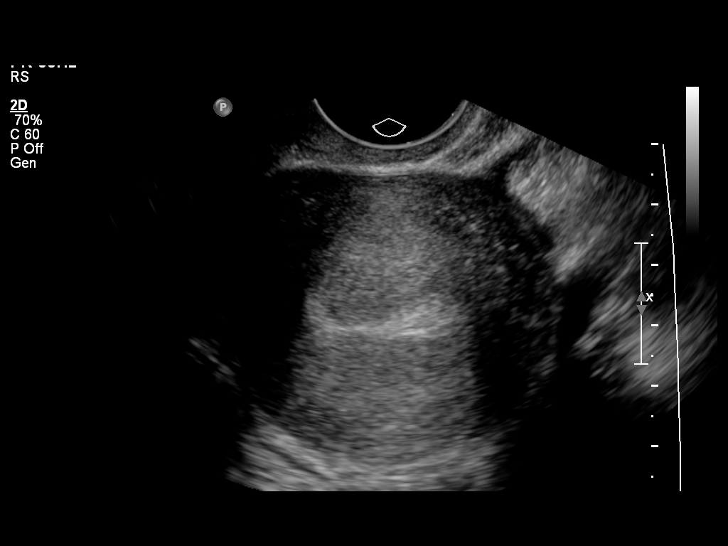
[im 12/20]
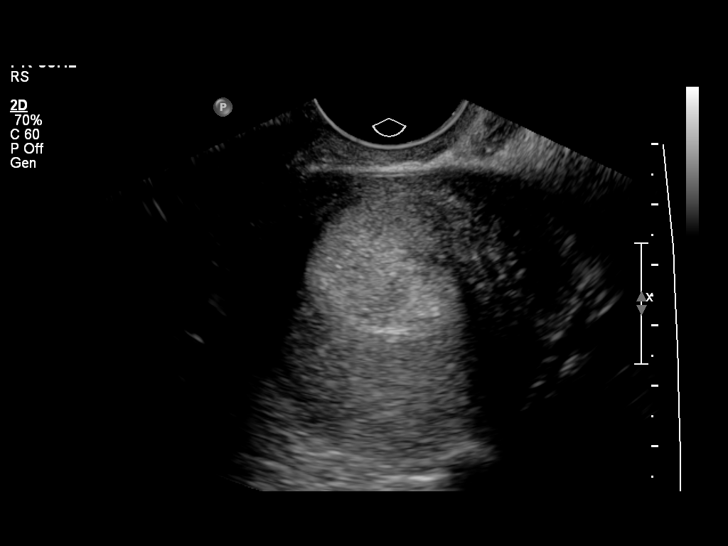
[im 14/20]
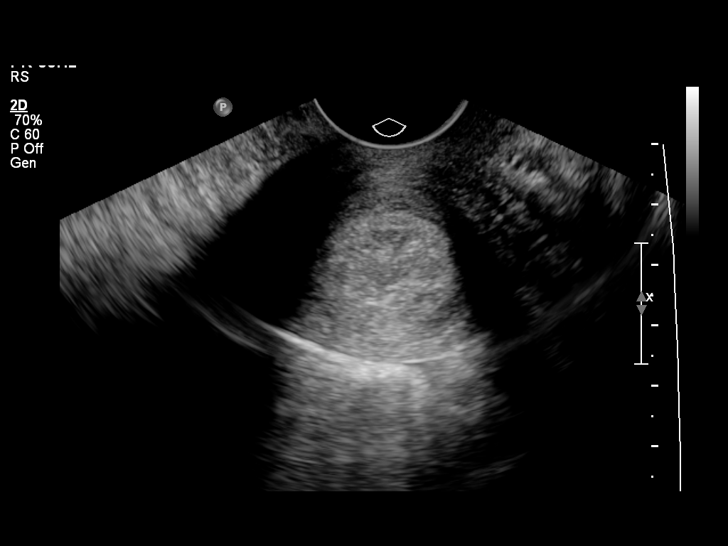
[im 15/20]
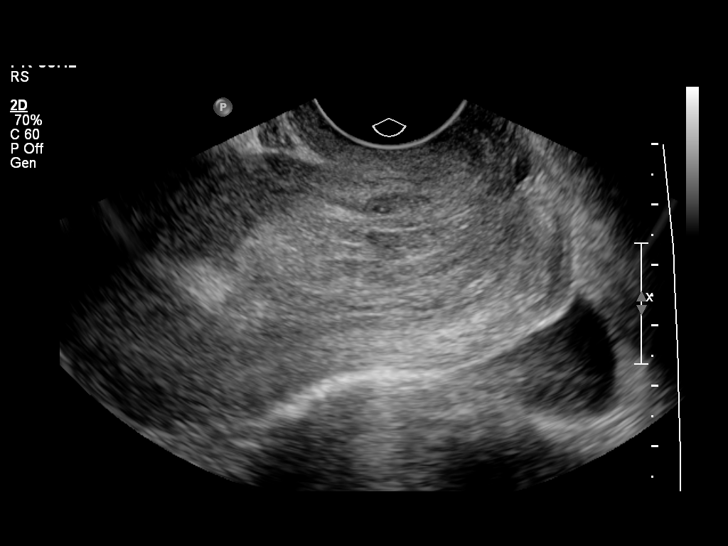
[im 17/20]
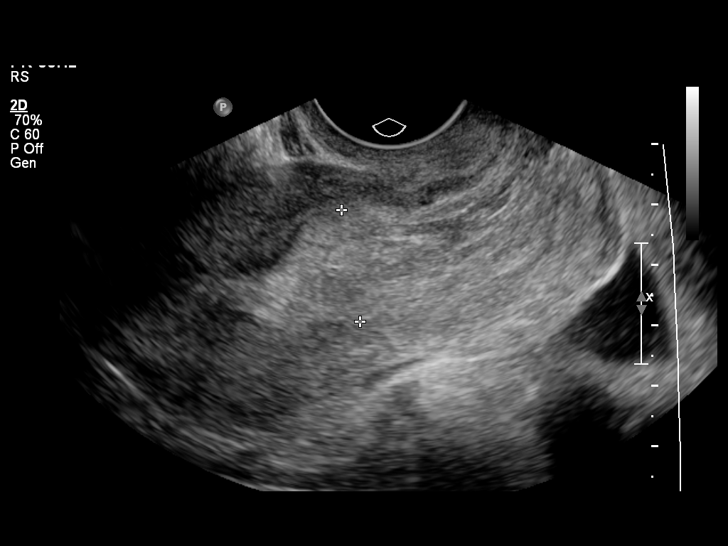
[im 18/20]
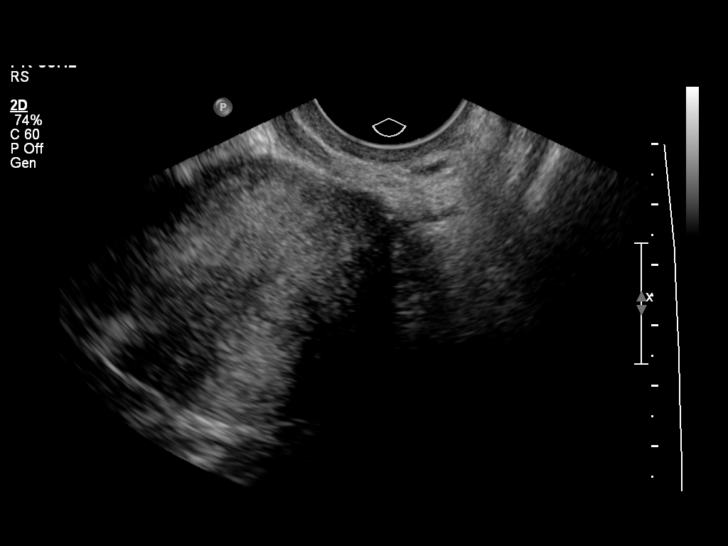
[im 20/20]
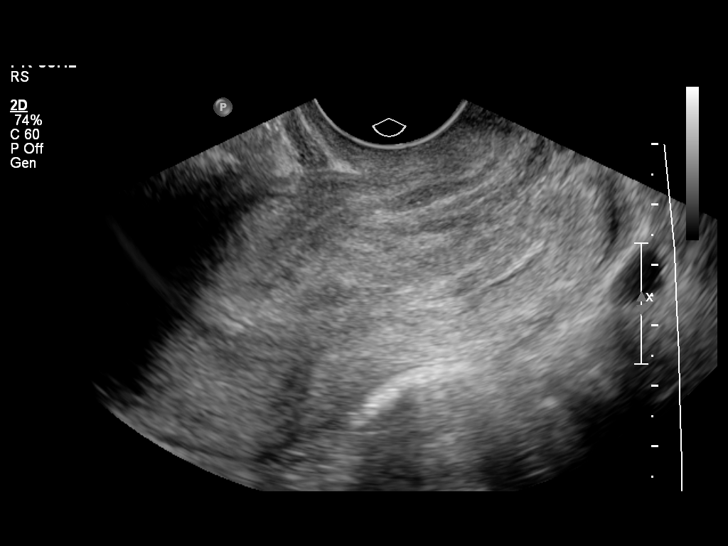

[13 of 20 positions shown; findings below may reference images not displayed]

FINDINGS: Gestational sac identified on the previous exam is no longer seen.
No fetal pole identified.
Endometrial complex of the mid to inferior uterine segments is
markedly thickened, heterogeneous and slightly increased in
echogenicity, measuring up to 2.0 cm thick, compatible with blood.
At the fundal region, endometrial complex measures 4 mm thick.
No myometrial mass lesion identified.
Small amount of cul-de-sac free fluid identified.
Neither ovary is visualized.
The patient was very tender with transvaginal imaging.
Ovaries appeared normal in size and morphology on the preceding
study of 09/03/2010.
IMPRESSION: Gestational sac and fetal pole identified within the endometrial
canal on the preceding study of 09/03/2010 are no longer identified
compatible with spontaneous abortion.
Endometrial canal of the mid to lower uterine segments contains
heterogeneous slightly echogenic material most likely representing
blood/clots.
Small amount of nonspecific free pelvic fluid.
Nonvisualization of ovaries.

## 2013-07-11 IMAGING — US US OB COMP LESS 14 WK
1 series · 14 of 28 positions shown · non-contrast
Comparison: 09/04/2010, of a previous pregnancy

CLINICAL DATA: Pregnant, cramping; no quantitative beta HCG for
correlation

OBSTETRIC <14 WK ULTRASOUND
TECHNIQUE: Transabdominal ultrasound was performed for evaluation
of the gestation as well as the maternal uterus and adnexal
regions.

[Series 1: us ob comp less 14 wks · 28 acquisitions, 14 frames shown]
[im 2/28]
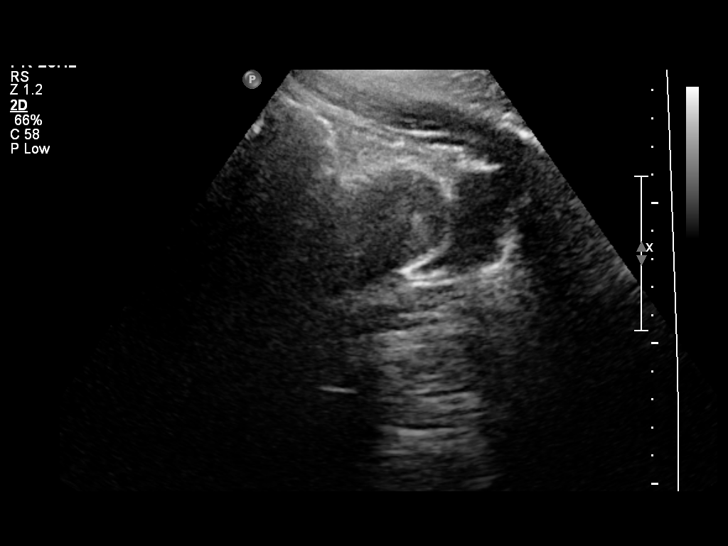
[im 4/28]
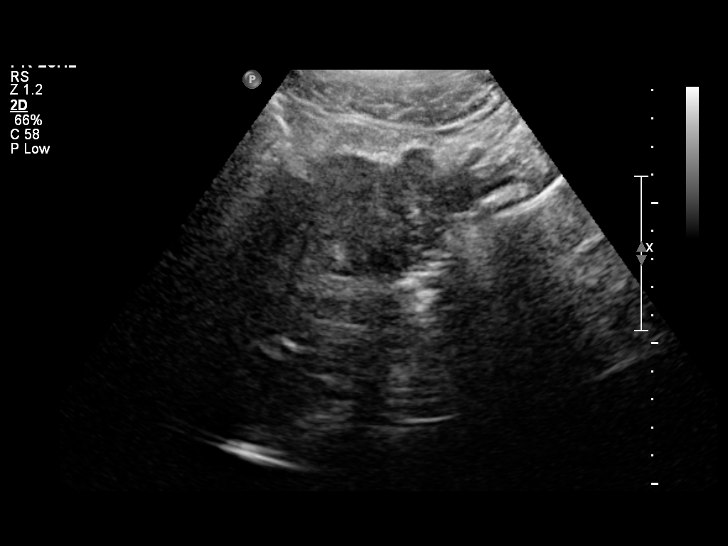
[im 6/28]
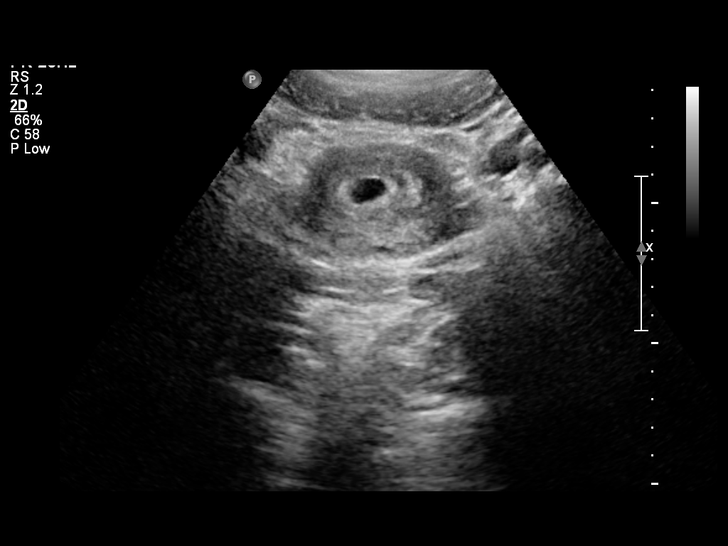
[im 8/28]
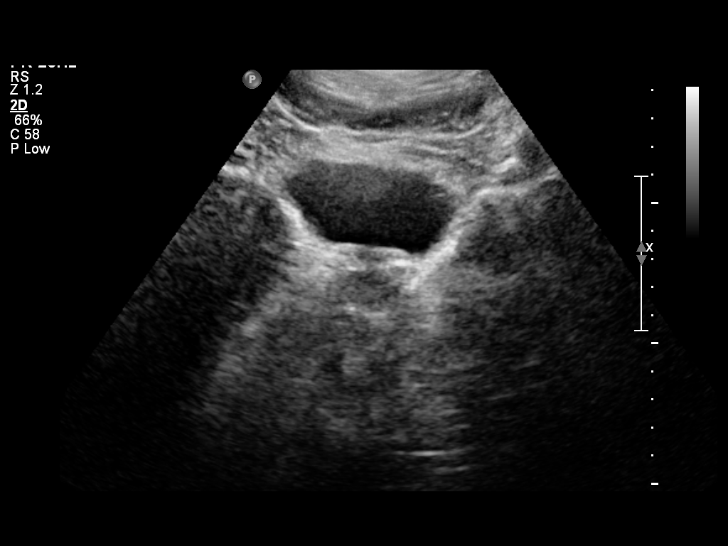
[im 10/28]
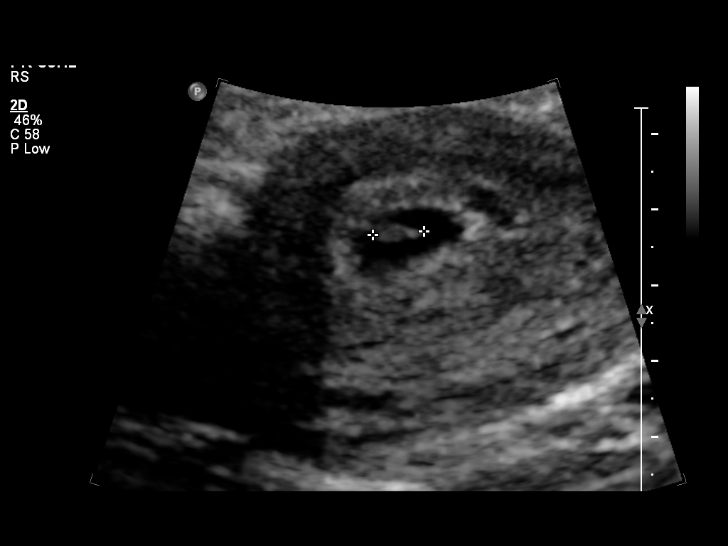
[im 12/28]
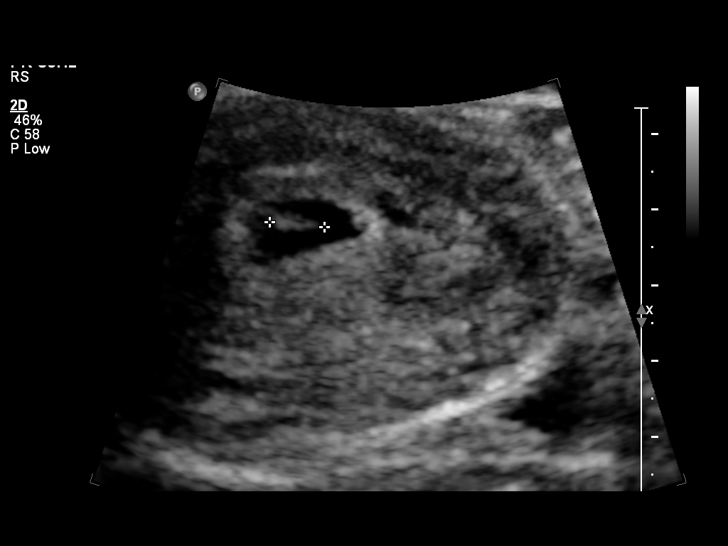
[im 14/28]
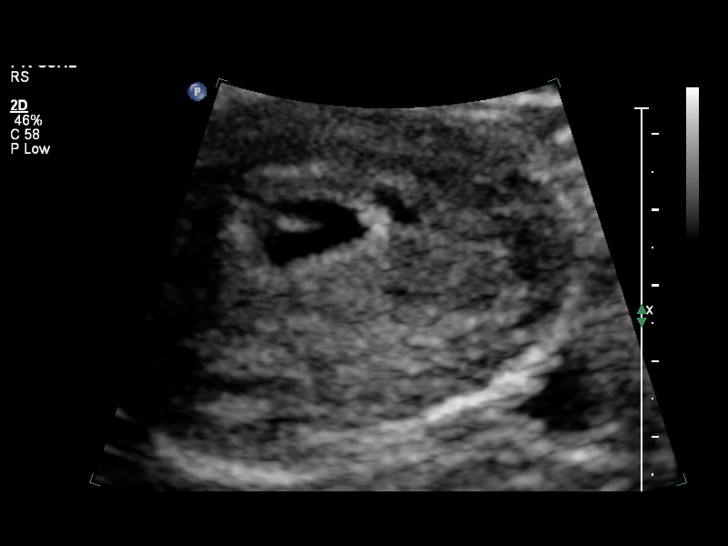
[im 16/28]
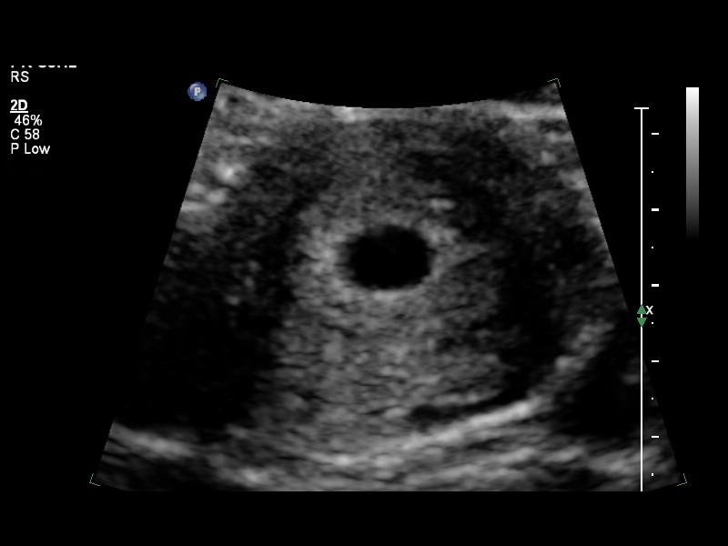
[im 18/28]
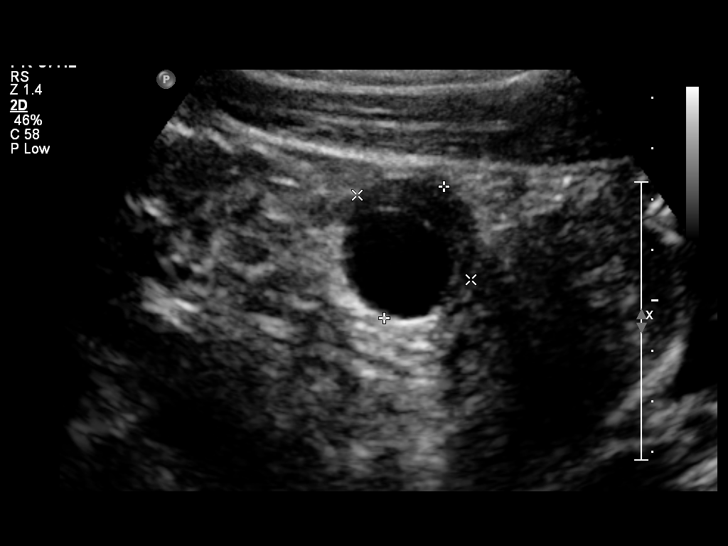
[im 20/28]
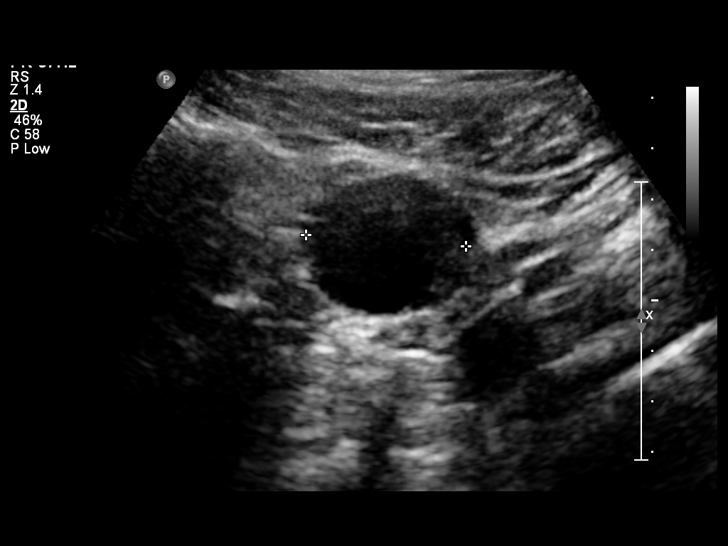
[im 22/28]
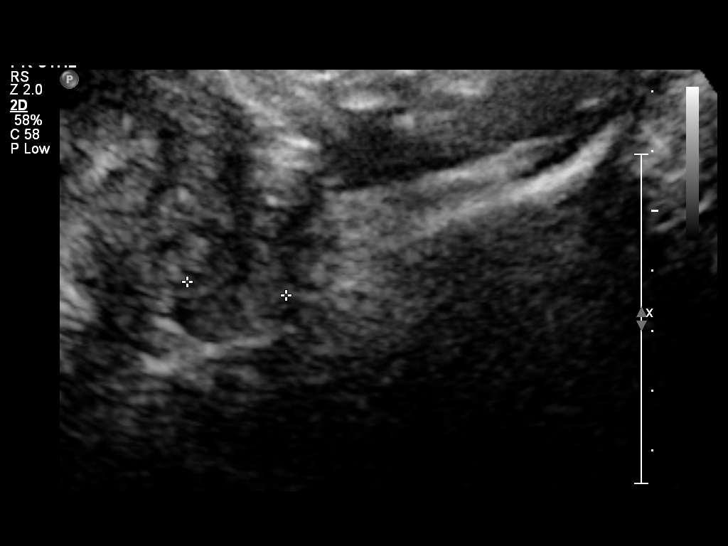
[im 24/28]
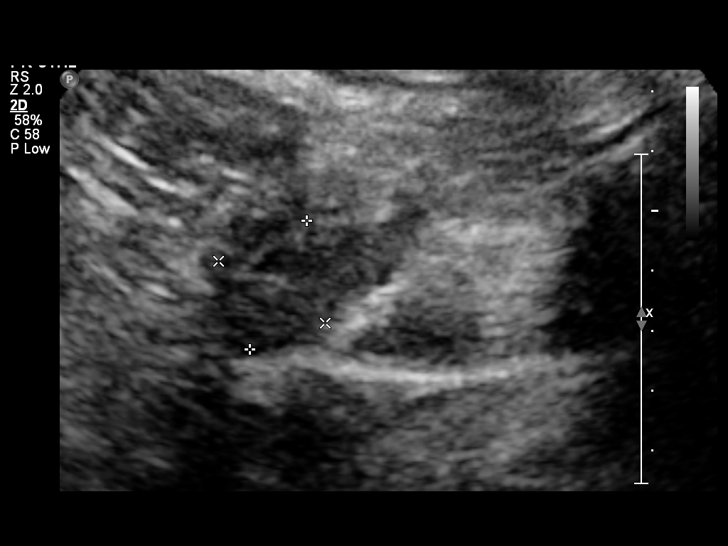
[im 26/28]
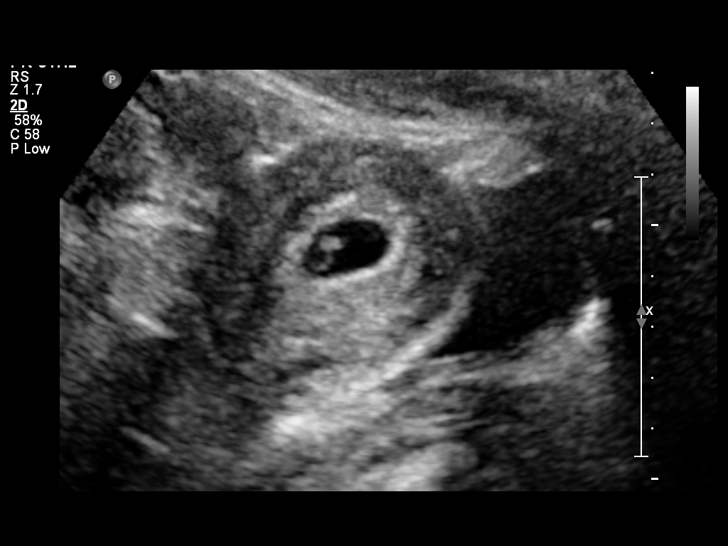
[im 28/28]
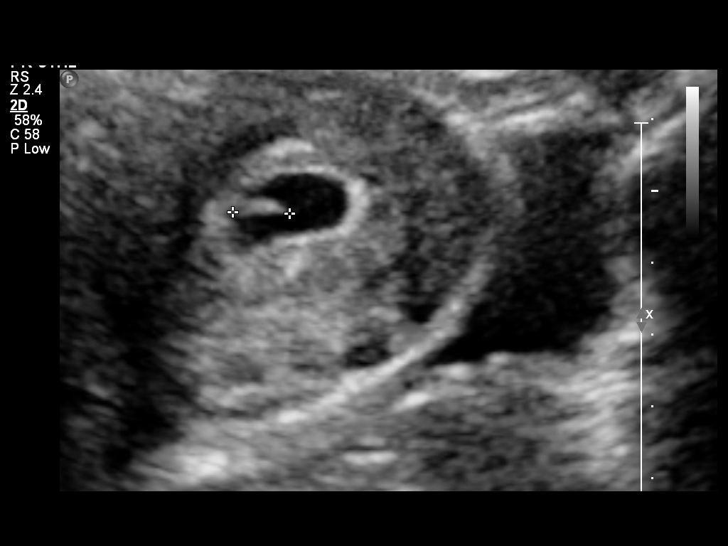

[14 of 28 positions shown; findings below may reference images not displayed]

Intrauterine gestational sac: Visualized/normal in shape.
Yolk sac: Present
Embryo: Present
Cardiac Activity: Present
Heart Rate: 106 bpm

CRL:  7 mm  6w  5d            US EDC: 09/05/2011

Maternal uterus/Adnexae:
Small subchorionic hemorrhage.
No free pelvic fluid.
Right ovary normal size, 2.8 x 2.8 x 3.6 cm, containing a corpus
luteum.
Left ovary normal size and morphology, 2.3 x 2.1 x 1.7 cm.
No adnexal masses.
IMPRESSION: Single live early intrauterine gestation measured at 6 weeks 5 days
EGA by crown-rump length.
Small subchorionic hemorrhage.

## 2014-02-05 ENCOUNTER — Encounter (HOSPITAL_COMMUNITY): Payer: Self-pay

## 2017-10-05 ENCOUNTER — Emergency Department (HOSPITAL_COMMUNITY): Payer: Medicaid Other

## 2017-10-05 ENCOUNTER — Emergency Department (HOSPITAL_COMMUNITY)
Admission: EM | Admit: 2017-10-05 | Discharge: 2017-10-06 | Disposition: A | Discharge status: Home / Self Care | Payer: Medicaid Other | Attending: Emergency Medicine | Admitting: Emergency Medicine

## 2017-10-05 ENCOUNTER — Encounter (HOSPITAL_COMMUNITY): Payer: Self-pay

## 2017-10-05 ENCOUNTER — Other Ambulatory Visit: Payer: Self-pay

## 2017-10-05 DIAGNOSIS — J45909 Unspecified asthma, uncomplicated: Secondary | ICD-10-CM | POA: Insufficient documentation

## 2017-10-05 DIAGNOSIS — Z79899 Other long term (current) drug therapy: Secondary | ICD-10-CM | POA: Diagnosis not present

## 2017-10-05 DIAGNOSIS — O219 Vomiting of pregnancy, unspecified: Secondary | ICD-10-CM

## 2017-10-05 DIAGNOSIS — N760 Acute vaginitis: Secondary | ICD-10-CM | POA: Diagnosis not present

## 2017-10-05 DIAGNOSIS — Z3A22 22 weeks gestation of pregnancy: Secondary | ICD-10-CM | POA: Diagnosis not present

## 2017-10-05 DIAGNOSIS — B9689 Other specified bacterial agents as the cause of diseases classified elsewhere: Secondary | ICD-10-CM

## 2017-10-05 DIAGNOSIS — Z3491 Encounter for supervision of normal pregnancy, unspecified, first trimester: Secondary | ICD-10-CM

## 2017-10-05 DIAGNOSIS — F17200 Nicotine dependence, unspecified, uncomplicated: Secondary | ICD-10-CM | POA: Insufficient documentation

## 2017-10-05 LAB — CBC WITH DIFFERENTIAL/PLATELET
ABS IMMATURE GRANULOCYTES: 0.1 10*3/uL (ref 0.0–0.1)
BASOS PCT: 1 %
Basophils Absolute: 0.1 10*3/uL (ref 0.0–0.1)
Eosinophils Absolute: 0.1 10*3/uL (ref 0.0–0.7)
Eosinophils Relative: 1 %
HCT: 45.8 % (ref 36.0–46.0)
Hemoglobin: 15.5 g/dL — ABNORMAL HIGH (ref 12.0–15.0)
Immature Granulocytes: 0 %
Lymphocytes Relative: 20 %
Lymphs Abs: 2.2 10*3/uL (ref 0.7–4.0)
MCH: 31 pg (ref 26.0–34.0)
MCHC: 33.8 g/dL (ref 30.0–36.0)
MCV: 91.6 fL (ref 78.0–100.0)
MONOS PCT: 9 %
Monocytes Absolute: 1 10*3/uL (ref 0.1–1.0)
NEUTROS ABS: 7.8 10*3/uL — AB (ref 1.7–7.7)
NEUTROS PCT: 69 %
PLATELETS: 226 10*3/uL (ref 150–400)
RBC: 5 MIL/uL (ref 3.87–5.11)
RDW: 11.9 % (ref 11.5–15.5)
WBC: 11.2 10*3/uL — ABNORMAL HIGH (ref 4.0–10.5)

## 2017-10-05 LAB — BASIC METABOLIC PANEL
ANION GAP: 10 (ref 5–15)
BUN: 7 mg/dL (ref 6–20)
CO2: 24 mmol/L (ref 22–32)
CREATININE: 0.77 mg/dL (ref 0.44–1.00)
Calcium: 9.5 mg/dL (ref 8.9–10.3)
Chloride: 103 mmol/L (ref 98–111)
GFR calc non Af Amer: >60 mL/min (ref >60)
GLUCOSE: 89 mg/dL (ref 70–99)
POTASSIUM: 3.4 mmol/L — AB (ref 3.5–5.1)
SODIUM: 137 mmol/L (ref 135–145)

## 2017-10-05 LAB — URINALYSIS, ROUTINE W REFLEX MICROSCOPIC
Bacteria, UA: NONE SEEN
Glucose, UA: NEGATIVE mg/dL
Hgb urine dipstick: NEGATIVE
Ketones, ur: 80 mg/dL — AB
Leukocytes, UA: NEGATIVE
Nitrite: NEGATIVE
Protein, ur: 30 mg/dL — AB
Specific Gravity, Urine: 1.029 (ref 1.005–1.030)
pH: 6 (ref 5.0–8.0)

## 2017-10-05 LAB — WET PREP, GENITAL
Sperm: NONE SEEN
Trich, Wet Prep: NONE SEEN
Yeast Wet Prep HPF POC: NONE SEEN

## 2017-10-05 LAB — I-STAT BETA HCG BLOOD, ED (MC, WL, AP ONLY): I-stat hCG, quantitative: >2000 m[IU]/mL — ABNORMAL HIGH (ref <5)

## 2017-10-05 LAB — ABO/RH: ABO/RH(D): O POS

## 2017-10-05 MED ORDER — METOCLOPRAMIDE HCL 5 MG/ML IJ SOLN
10.0000 mg | Freq: Once | INTRAMUSCULAR | Status: AC
  Administered 2017-10-05: 10 mg via INTRAVENOUS
  Filled 2017-10-05: qty 2

## 2017-10-05 MED ORDER — METOCLOPRAMIDE HCL 10 MG PO TABS: 10.0000 mg | ORAL_TABLET | Freq: Four times a day (QID) | ORAL | 0 refills | Status: AC

## 2017-10-05 MED ORDER — DOXYLAMINE-PYRIDOXINE 10-10 MG PO TBEC: 2.0000 | DELAYED_RELEASE_TABLET | Freq: Every day | ORAL | 0 refills | Status: AC

## 2017-10-05 MED ORDER — SODIUM CHLORIDE 0.9 % IV SOLN
INTRAVENOUS | Status: DC
  Administered 2017-10-05: 21:00:00 via INTRAVENOUS

## 2017-10-05 MED ORDER — ONDANSETRON HCL 40 MG/20ML IJ SOLN
6.0000 mg | Freq: Once | INTRAMUSCULAR | Status: AC
  Administered 2017-10-05: 6 mg via INTRAVENOUS
  Filled 2017-10-05: qty 3

## 2017-10-05 MED ORDER — DIPHENHYDRAMINE HCL 25 MG PO CAPS
25.0000 mg | ORAL_CAPSULE | Freq: Once | ORAL | Status: AC
  Administered 2017-10-05: 25 mg via ORAL
  Filled 2017-10-05: qty 1

## 2017-10-05 MED ORDER — METRONIDAZOLE 0.75 % VA GEL: 1.0000 | Freq: Two times a day (BID) | VAGINAL | 0 refills | Status: AC

## 2017-10-05 NOTE — Discharge Instructions (Addendum)
Please read attached information. If you experience any new or worsening signs or symptoms please return to the emergency room for evaluation. Please follow-up with your primary care provider or specialist as discussed. Please use medication prescribed only as directed and discontinue taking if you have any concerning signs or symptoms.   °

## 2017-10-05 NOTE — ED Notes (Signed)
Pt vomiting, EDP made aware 

## 2017-10-05 NOTE — ED Triage Notes (Signed)
Patient states that she is about 2 months pregnant and c/o N/V x3 days. States unable to hold any food/drink down.

## 2017-10-05 NOTE — ED Provider Notes (Signed)
Patient handed off to me by previous ED PA at shift change pending wet prep results, OB ultrasound and symptom recheck.  Please see previous note for full HPI.  Briefly, patient approximately 2 months pregnant is here with nausea vomiting and minimal abdominal pain.  She has had no prenatal care.    2330: Reevaluated patient.  Discussed reassuring results of ultrasound.  She has drank 3 cups of water and feels her nausea has improved.  She is requesting to be discharged.  Will discharge with antiemetics, recommendation to keep well-hydrated.  Discussed return precautions.  Wet prep with clue cells and WBCs, will treat for bacterial vaginosis with Flagyl gel, Flagyl p.o. may worsen her nausea and vomiting.   Liberty HandyGibbons, Shanae Luo J, PA-C 10/06/17 0102    Margarita Grizzleay, Danielle, MD 10/06/17 1435

## 2017-10-05 NOTE — ED Notes (Signed)
Pt in US

## 2017-10-05 NOTE — ED Provider Notes (Signed)
Patient placed in Quick Look pathway, seen and evaluated   Chief Complaint: nausea and vomiting in early pregnancy  HPI:  Brooke Marquez is a 27 y.o. [id number], LMP 08/04/17. [facility] and. She has not started prenatal care. For the past 2 days patient has had vomiting that occurs every time she tries to eat or drink. Patient has not started prenatal care.   ROS: GI: n/v, abdominal pain  Physical Exam:  BP 121/71   Pulse 79   Temp 98.7 F (37.1 C) (Oral)   Resp 16   Ht 5\' 2"  (1.575 m)   Wt 74.8 kg (165 lb)   LMP 08/04/2017 (Approximate)   SpO2 99%   BMI 30.18 kg/m    Gen: No distress  Neuro: Awake and Alert  Skin: Warm and dry  GI: actively vomiting in exam room       Initiation of care has begun. The patient has been counseled on the process, plan, and necessity for staying for the completion/evaluation, and the remainder of the medical screening examination    Janne Napoleoneese, Leisha Trinkle M, NP 10/06/17 1700    Gerhard MunchLockwood, Robert, MD 10/07/17 [phone]

## 2017-10-05 NOTE — ED Provider Notes (Signed)
[facility] Provider Note   CSN: [id number] Arrival date & time: 10/05/17  1952     History   Chief Complaint Chief Complaint  Patient presents with  . Emesis During Pregnancy    HPI Brooke Marquez is a 27 y.o. female.  HPI   [age] YOF [id number]  female presents today with with complaints of ongoing nausea. Patient notes that her last partial cycle was at the beginning of May but is uncertain to the exact date. Patient notes that she [facility] and had a positive pregnancy test but has not had any prenatal care. Patient notes vomiting over the last 2 days, nonbloody. She notes this is worsened with truncal eat or drink. She reports very minimal generalized abdominal discomfort, nonsevere nonfocal. She denies any fever, close sick contacts. Patient notes this is similar to previous episodes of pregnancy.she denies any vaginal bleeding or discharge.   Past Medical History:  Diagnosis Date  . Asthma    rare inhaler, exercise induced  . Bipolar affective (HCC)   . History of chicken pox   . Scoliosis     There are no active problems to display for this patient.   Past Surgical History:  Procedure Laterality Date  . FOOT SURGERY       OB History    Gravida  5   Para  1   Term  1   Preterm      AB  2   Living  1     SAB  2   TAB      Ectopic      Multiple      Live Births  1            Home Medications    Prior to Admission medications   Medication Sig Start Date End Date Taking? Authorizing Provider  Prenatal Vit-Fe Fumarate-FA (PRENATAL MULTIVITAMIN) TABS Take 1 tablet by mouth daily.    Yes [provider]  Doxylamine-Pyridoxine 10-10 MG TBEC Take 2 tablets by mouth at bedtime. 10/05/17   Obelia Bonello, Tinnie GensJeffrey, PA-C  metoCLOPramide (REGLAN) 10 MG tablet Take 1 tablet (10 mg total) by mouth every 6 (six) hours. 10/05/17   Eyvonne MechanicHedges, Leylah Tarnow, PA-C    Family History Family History  Adopted: Yes   Problem Relation Age of Onset  . Hypertension Mother   . Cancer Mother     Social History Social History   Tobacco Use  . Smoking status: Former Smoker    Packs/day: 1.00    Last attempt to quit: 01/09/2011    Years since quitting: 6.7  . Smokeless tobacco: Never Used  . Tobacco comment: stopped smoking one week ago  Substance Use Topics  . Alcohol use: No  . Drug use: Yes    Types: Marijuana    Comment: stopped smoking one week ago     Allergies   Codeine   Review of Systems Review of Systems  All other systems reviewed and are negative.    Physical Exam Updated Vital Signs BP 121/71   Pulse 79   Temp 98.7 F (37.1 C) (Oral)   Resp 16   Ht 5\' 2"  (1.575 m)   Wt 74.8 kg (165 lb)   LMP 08/04/2017 (Approximate)   SpO2 99%   BMI 30.18 kg/m   Physical Exam  Constitutional: She is oriented to person, place, and time. She appears well-developed and well-nourished.  HENT:  Head: Normocephalic and atraumatic.  Eyes: Pupils  are equal, round, and reactive to light. Conjunctivae are normal. Right eye exhibits no discharge. Left eye exhibits no discharge. No scleral icterus.  Neck: Normal range of motion. No JVD present. No tracheal deviation present.  Pulmonary/Chest: Effort normal. No stridor.  Abdominal: Soft. She exhibits no distension and no mass. There is no tenderness. There is no rebound and no guarding. No hernia.  Genitourinary:  Genitourinary Comments: Small amount of white discharge noted in vaginal vault - no cervical motion tenderness, adnexal tenderness or masses  Neurological: She is alert and oriented to person, place, and time. Coordination normal.  Psychiatric: She has a normal mood and affect. Her behavior is normal. Judgment and thought content normal.  Nursing note and vitals reviewed.    ED Treatments / Results  Labs (all labs ordered are listed, but only abnormal results are displayed) Labs Reviewed  CBC WITH DIFFERENTIAL/PLATELET -  Abnormal; Notable for the following components:      Result Value   WBC 11.2 (*)    Hemoglobin 15.5 (*)    Neutro Abs 7.8 (*)    All other components within normal limits  BASIC METABOLIC PANEL - Abnormal; Notable for the following components:   Potassium 3.4 (*)    All other components within normal limits  I-STAT BETA HCG BLOOD, ED ([facility], [facility], [facility] ONLY) - Abnormal; Notable for the following components:   I-stat hCG, quantitative >2,000.0 (*)    All other components within normal limits  WET PREP, GENITAL  URINALYSIS, ROUTINE W REFLEX MICROSCOPIC  ABO/RH  GC/CHLAMYDIA PROBE AMP ([place]) NOT AT [facility]    EKG None  Radiology US Ob Comp Less 14 Wks  Result Date: 10/05/2017 CLINICAL DATA:  Vomiting and nausea for 3 days. Estimated gestational age by LMP is 7 weeks 4 days. Quantitative beta HCG is not indicated. EXAM: OBSTETRIC <14 WK [place] AND TRANSVAGINAL OB US TECHNIQUE: Both transabdominal and transvaginal ultrasound examinations were performed for complete evaluation of the gestation as well as the maternal uterus, adnexal regions, and pelvic cul-de-sac. Transvaginal technique was performed to assess early pregnancy. COMPARISON:  None. FINDINGS: Intrauterine gestational sac: A single intrauterine pregnancy is identified. Yolk sac:  Yolk sac is present. Embryo:  Fetal pole is identified. Cardiac Activity: Fetal cardiac activity is observed. Heart Rate: 115 bpm CRL: 4.4 mm   6 w   1 d                  [place] EDC: 05/30/2018 Subchorionic hemorrhage: A small subchorionic hemorrhage is identified inferiorly. Maternal uterus/adnexae: Uterus is anteverted. No myometrial mass lesions are identified. Small nabothian cysts in the cervix. The right ovary is visualized and appears normal with normal follicular changes seen. Left ovary is not definitely identified. No abnormal adnexal masses are seen in images of the left adnexal region. Small amount of free fluid in the pelvis. IMPRESSION: Single intrauterine  pregnancy. Estimated gestational age by crown-rump length is 6 weeks 1 day. Small subchorionic hemorrhage. Electronically Signed   By: Burman Nieves M.D.   On: 10/05/2017 22:14   US Ob Transvaginal  Result Date: 10/05/2017 CLINICAL DATA:  Vomiting and nausea for 3 days. Estimated gestational age by LMP is 7 weeks 4 days. Quantitative beta HCG is not indicated. EXAM: OBSTETRIC <14 WK [place] AND TRANSVAGINAL OB US TECHNIQUE: Both transabdominal and transvaginal ultrasound examinations were performed for complete evaluation of the gestation as well as the maternal uterus, adnexal regions, and pelvic cul-de-sac. Transvaginal technique was performed to assess  early pregnancy. COMPARISON:  None. FINDINGS: Intrauterine gestational sac: A single intrauterine pregnancy is identified. Yolk sac:  Yolk sac is present. Embryo:  Fetal pole is identified. Cardiac Activity: Fetal cardiac activity is observed. Heart Rate: 115 bpm CRL: 4.4 mm   6 w   1 d                  [place] EDC: 05/30/2018 Subchorionic hemorrhage: A small subchorionic hemorrhage is identified inferiorly. Maternal uterus/adnexae: Uterus is anteverted. No myometrial mass lesions are identified. Small nabothian cysts in the cervix. The right ovary is visualized and appears normal with normal follicular changes seen. Left ovary is not definitely identified. No abnormal adnexal masses are seen in images of the left adnexal region. Small amount of free fluid in the pelvis. IMPRESSION: Single intrauterine pregnancy. Estimated gestational age by crown-rump length is 6 weeks 1 day. Small subchorionic hemorrhage. Electronically Signed   By: Burman Nieves M.D.   On: 10/05/2017 22:14    Procedures Procedures (including critical care time)  Medications Ordered in ED Medications  0.9 %  sodium chloride infusion ( Intravenous New Bag/Given 10/05/17 2034)  diphenhydrAMINE (BENADRYL) capsule 25 mg (has no administration in time range)  metoCLOPramide (REGLAN) injection  10 mg (10 mg Intravenous Given 10/05/17 2034)     Initial Impression / Assessment and Plan / ED Course  I have reviewed the triage vital signs and the nursing notes.  Pertinent labs & imaging results that were available during my care of the patient were reviewed by me and considered in my medical decision making (see chart for details).     Labs: I-STAT beta hCG, CBC, BMP, ABO Rh  Imaging:ultrasound OB complete  Consults:  Therapeutics: Reglan, normal saline  Discharge Meds: likely just, Reglan  Assessment/Plan: 28 YOF presents today with complaints of nausea and vomiting. Patient has a history of the same in previous pregnancies. She was given Reglan prior to my evaluation which seemed to be improving her symptoms. Pt has reassuring laboratory evaluation. Pt pending [place] at the time of shift change. Care will be transferred to oncoming provider pending ultrasound, wet, prep, and fluid challenge. Anticipate patient being discharged as she has improvement in her symptoms is tolerating by mouth.I discussed the risks and benefits of using antinausea medication patient elected to proceed. Patient will follow up as an outpatient with OB/GYN for ongoing evaluation and management. She'll return immediately with any new or worsening signs or symptoms or if symptoms do not improve.      Final Clinical Impressions(s) / ED Diagnoses   Final diagnoses:  Nausea and vomiting during pregnancy prior to [redacted] weeks gestation    ED Discharge Orders        Ordered    metoCLOPramide (REGLAN) 10 MG tablet  Every 6 hours     10/05/17 2214    Doxylamine-Pyridoxine 10-10 MG TBEC  Daily at bedtime     10/05/17 2214       Rosalio Loud 10/05/17 2217    Gerhard Munch, MD 10/11/17 2118

## 2017-10-06 LAB — GC/CHLAMYDIA PROBE AMP (~~LOC~~) NOT AT ARMC
Chlamydia: NEGATIVE
Neisseria Gonorrhea: NEGATIVE

## 2017-10-09 ENCOUNTER — Emergency Department (HOSPITAL_COMMUNITY)
Admission: EM | Admit: 2017-10-09 | Discharge: 2017-10-09 | Disposition: A | Discharge status: Home / Self Care | Payer: Medicaid Other | Attending: Emergency Medicine | Admitting: Emergency Medicine

## 2017-10-09 ENCOUNTER — Encounter (HOSPITAL_COMMUNITY): Payer: Self-pay

## 2017-10-09 DIAGNOSIS — J45909 Unspecified asthma, uncomplicated: Secondary | ICD-10-CM | POA: Insufficient documentation

## 2017-10-09 DIAGNOSIS — Z87891 Personal history of nicotine dependence: Secondary | ICD-10-CM | POA: Insufficient documentation

## 2017-10-09 DIAGNOSIS — Z79899 Other long term (current) drug therapy: Secondary | ICD-10-CM | POA: Insufficient documentation

## 2017-10-09 DIAGNOSIS — R1084 Generalized abdominal pain: Secondary | ICD-10-CM | POA: Insufficient documentation

## 2017-10-09 DIAGNOSIS — K5901 Slow transit constipation: Secondary | ICD-10-CM | POA: Diagnosis not present

## 2017-10-09 DIAGNOSIS — K5900 Constipation, unspecified: Secondary | ICD-10-CM | POA: Diagnosis present

## 2017-10-09 LAB — COMPREHENSIVE METABOLIC PANEL
ALT: 17 U/L (ref 0–44)
AST: 14 U/L — ABNORMAL LOW (ref 15–41)
Albumin: 3.9 g/dL (ref 3.5–5.0)
Alkaline Phosphatase: 50 U/L (ref 38–126)
Anion gap: 8 (ref 5–15)
BUN: 5 mg/dL — ABNORMAL LOW (ref 6–20)
CO2: 22 mmol/L (ref 22–32)
Calcium: 9.2 mg/dL (ref 8.9–10.3)
Chloride: 108 mmol/L (ref 98–111)
Creatinine, Ser: 0.67 mg/dL (ref 0.44–1.00)
GFR calc Af Amer: >60 mL/min (ref >60)
GFR calc non Af Amer: >60 mL/min (ref >60)
Glucose, Bld: 92 mg/dL (ref 70–99)
Potassium: 3.5 mmol/L (ref 3.5–5.1)
Sodium: 138 mmol/L (ref 135–145)
Total Bilirubin: 1.1 mg/dL (ref 0.3–1.2)
Total Protein: 6.7 g/dL (ref 6.5–8.1)

## 2017-10-09 LAB — CBC WITH DIFFERENTIAL/PLATELET
Abs Immature Granulocytes: 0 10*3/uL (ref 0.0–0.1)
Basophils Absolute: 0 10*3/uL (ref 0.0–0.1)
Basophils Relative: 0 %
Eosinophils Absolute: 0.1 10*3/uL (ref 0.0–0.7)
Eosinophils Relative: 1 %
HCT: 39.2 % (ref 36.0–46.0)
Hemoglobin: 13.5 g/dL (ref 12.0–15.0)
Immature Granulocytes: 0 %
Lymphocytes Relative: 24 %
Lymphs Abs: 2.5 10*3/uL (ref 0.7–4.0)
MCH: 31.1 pg (ref 26.0–34.0)
MCHC: 34.4 g/dL (ref 30.0–36.0)
MCV: 90.3 fL (ref 78.0–100.0)
Monocytes Absolute: 0.7 10*3/uL (ref 0.1–1.0)
Monocytes Relative: 7 %
Neutro Abs: 7.2 10*3/uL (ref 1.7–7.7)
Neutrophils Relative %: 68 %
Platelets: 233 10*3/uL (ref 150–400)
RBC: 4.34 MIL/uL (ref 3.87–5.11)
RDW: 11.5 % (ref 11.5–15.5)
WBC: 10.6 10*3/uL — ABNORMAL HIGH (ref 4.0–10.5)

## 2017-10-09 LAB — URINALYSIS, ROUTINE W REFLEX MICROSCOPIC
Bilirubin Urine: NEGATIVE
Glucose, UA: NEGATIVE mg/dL
Hgb urine dipstick: NEGATIVE
Ketones, ur: 80 mg/dL — AB
Leukocytes, UA: NEGATIVE
Nitrite: NEGATIVE
Protein, ur: NEGATIVE mg/dL
Specific Gravity, Urine: 1.019 (ref 1.005–1.030)
pH: 8 (ref 5.0–8.0)

## 2017-10-09 LAB — HCG, QUANTITATIVE, PREGNANCY: hCG, Beta Chain, Quant, S: 90608 m[IU]/mL — ABNORMAL HIGH (ref <5)

## 2017-10-09 MED ORDER — SODIUM CHLORIDE 0.9 % IV BOLUS
1000.0000 mL | Freq: Once | INTRAVENOUS | Status: AC
  Administered 2017-10-09: 1000 mL via INTRAVENOUS

## 2017-10-09 MED ORDER — PROMETHAZINE HCL 25 MG/ML IJ SOLN
25.0000 mg | Freq: Once | INTRAMUSCULAR | Status: AC
  Administered 2017-10-09: 25 mg via INTRAVENOUS
  Filled 2017-10-09: qty 1

## 2017-10-09 MED ORDER — PROMETHAZINE HCL 25 MG PO TABS: 25.0000 mg | ORAL_TABLET | Freq: Four times a day (QID) | ORAL | 0 refills | Status: AC | PRN

## 2017-10-09 NOTE — ED Notes (Signed)
Patient verbalizes understanding of discharge instructions. Opportunity for questioning and answers were provided. Armband removed by staff, pt discharged from ED ambulatory.   

## 2017-10-09 NOTE — ED Triage Notes (Signed)
Pt was seen 10-05-17 for N/V.  Is still unable to tolerate po fluids/foods, metamucil.  Has not had a BM in 1 week.

## 2017-10-09 NOTE — ED Notes (Signed)
In to reassess - states feeling much better; lights dimmed and warm blankets applied for comfort

## 2017-10-09 NOTE — ED Notes (Signed)
Report given to oncoming RN.

## 2017-10-09 NOTE — Discharge Instructions (Addendum)
You can take milk of magnesia if needed. I would increase your fluids. Prune juice warmed up will help as well.

## 2017-10-11 NOTE — ED Provider Notes (Addendum)
[facility] EMERGENCY DEPARTMENT Provider Note   CSN: [id number] Arrival date & time: 10/09/17  1355     History   Chief Complaint Chief Complaint  Patient presents with  . Constipation    HPI Brooke Marquez is a 27 y.o. female.  HPI Patient presents to the emergency department with complaints of severe abdominal pain and constipation.  The patient states she has not had a bowel movement 1 week.  Patient states she is pregnant currently.  Patient states nothing seems to make the condition better or worse.  She states she took over-the-counter Metamucil with no significant relief of her symptoms.  Patient has also been having nausea patient states that she did not take any other medications.  The patient denies chest pain, shortness of breath, headache,blurred vision, neck pain, fever, cough, weakness, numbness, dizziness, anorexia, edema,vomiting, diarrhea, rash, back pain, dysuria, hematemesis, bloody stool, near syncope, or syncope. Past Medical History:  Diagnosis Date  . Asthma    rare inhaler, exercise induced  . Bipolar affective (HCC)   . History of chicken pox   . Scoliosis     There are no active problems to display for this patient.   Past Surgical History:  Procedure Laterality Date  . FOOT SURGERY       OB History    Gravida  6   Para  1   Term  1   Preterm      AB  2   Living  1     SAB  2   TAB      Ectopic      Multiple      Live Births  1            Home Medications    Prior to Admission medications   Medication Sig Start Date End Date Taking? Authorizing Provider  Doxylamine-Pyridoxine 10-10 MG TBEC Take 2 tablets by mouth at bedtime. 10/05/17  Yes Hedges, Tinnie Gens, PA-C  metoCLOPramide (REGLAN) 10 MG tablet Take 1 tablet (10 mg total) by mouth every 6 (six) hours. 10/05/17  Yes Hedges, Tinnie Gens, PA-C  metroNIDAZOLE (METROGEL) 0.75 % vaginal gel Place 1 Applicatorful vaginally 2 (two) times daily. 10/05/17  Yes  Liberty Handy, PA-C  Prenatal Vit-Fe Fumarate-FA (PRENATAL MULTIVITAMIN) TABS Take 1 tablet by mouth daily.    Yes [provider]  promethazine (PHENERGAN) 25 MG tablet Take 1 tablet (25 mg total) by mouth every 6 (six) hours as needed for nausea or vomiting. 10/09/17   Louis Ivery, Cristal Deer, PA-C    Family History Family History  Adopted: Yes  Problem Relation Age of Onset  . Hypertension Mother   . Cancer Mother     Social History Social History   Tobacco Use  . Smoking status: Former Smoker    Packs/day: 1.00    Last attempt to quit: 01/09/2011    Years since quitting: 6.7  . Smokeless tobacco: Never Used  . Tobacco comment: stopped smoking one week ago  Substance Use Topics  . Alcohol use: No  . Drug use: Yes    Types: Marijuana    Comment: stopped smoking one week ago     Allergies   Codeine   Review of Systems Review of Systems All other systems negative except as documented in the HPI. All pertinent positives and negatives as reviewed in the HPI.  Physical Exam Updated Vital Signs BP 102/60 (BP Location: Right Arm)   Pulse 74   Temp 98.2 F (36.8  C) (Oral)   Resp 14   Ht 5\' 2"  (1.575 m)   Wt 74.8 kg (165 lb)   LMP 08/04/2017 (Approximate)   SpO2 100%   BMI 30.18 kg/m   Physical Exam  Constitutional: She is oriented to person, place, and time. She appears well-developed and well-nourished. No distress.  HENT:  Head: Normocephalic and atraumatic.  Mouth/Throat: Oropharynx is clear and moist.  Eyes: Pupils are equal, round, and reactive to light.  Neck: Normal range of motion. Neck supple.  Cardiovascular: Normal rate, regular rhythm and normal heart sounds. Exam reveals no gallop and no friction rub.  No murmur heard. Pulmonary/Chest: Effort normal and breath sounds normal. No respiratory distress. She has no wheezes.  Abdominal: Soft. Bowel sounds are normal. She exhibits no distension and no mass. There is tenderness. There is no rebound  and no guarding.  Neurological: She is alert and oriented to person, place, and time. She exhibits normal muscle tone. Coordination normal.  Skin: Skin is warm and dry. Capillary refill takes less than 2 seconds. No rash noted. No erythema.  Psychiatric: She has a normal mood and affect. Her behavior is normal.  Nursing note and vitals reviewed.    ED Treatments / Results  Labs (all labs ordered are listed, but only abnormal results are displayed) Labs Reviewed  COMPREHENSIVE METABOLIC PANEL - Abnormal; Notable for the following components:      Result Value   BUN 5 (*)    AST 14 (*)    All other components within normal limits  CBC WITH DIFFERENTIAL/PLATELET - Abnormal; Notable for the following components:   WBC 10.6 (*)    All other components within normal limits  URINALYSIS, ROUTINE W REFLEX MICROSCOPIC - Abnormal; Notable for the following components:   APPearance HAZY (*)    Ketones, ur 80 (*)    All other components within normal limits  HCG, QUANTITATIVE, PREGNANCY - Abnormal; Notable for the following components:   hCG, Beta Chain, Quant, S 90,608 (*)    All other components within normal limits    EKG None  Radiology No results found.  Procedures Procedures (including critical care time)  Medications Ordered in ED Medications  sodium chloride 0.9 % bolus 1,000 mL (0 mLs Intravenous Stopped 10/09/17 1922)  promethazine (PHENERGAN) injection 25 mg (25 mg Intravenous Given 10/09/17 1738)  sodium chloride 0.9 % bolus 1,000 mL (0 mLs Intravenous Stopped 10/09/17 2145)     Initial Impression / Assessment and Plan / ED Course  I have reviewed the triage vital signs and the nursing notes.  Pertinent labs & imaging results that were available during my care of the patient were reviewed by me and considered in my medical decision making (see chart for details).    She was given Phenergan and IV fluids.  Patient is resting comfortably no acute distress.  I did advise her  that she will need to increase her fluid intake and she could take milk of magnesia for any constipation.  Told to increase her dietary fiber as well.  Also advised the use of prune juice  Final Clinical Impressions(s) / ED Diagnoses   Final diagnoses:  Slow transit constipation  Generalized abdominal pain    ED Discharge Orders        Ordered    promethazine (PHENERGAN) 25 MG tablet  Every 6 hours PRN     10/09/17 2132       Charlestine Night, PA-C 10/13/17 0059    Mckinzee Spirito, Cristal Deer, PA-C  10/13/17 0059    Donnetta Hutchingook, Brian, MD 10/13/17 1241

## 2018-12-07 IMAGING — US US OB TRANSVAGINAL
1 series · 13 of 28 positions shown · non-contrast
Comparison: None.

CLINICAL DATA: Vomiting and nausea for 3 days. Estimated
gestational age by LMP is 7 weeks 4 days. Quantitative beta HCG is
not indicated.

EXAM:
OBSTETRIC <14 WK US AND TRANSVAGINAL OB US
TECHNIQUE: Both transabdominal and transvaginal ultrasound examinations were
performed for complete evaluation of the gestation as well as the
maternal uterus, adnexal regions, and pelvic cul-de-sac.
Transvaginal technique was performed to assess early pregnancy.

[Series 1: us ob transvaginal · 0.17mm/px · 13 of 47 slices shown]
[im 2/47]
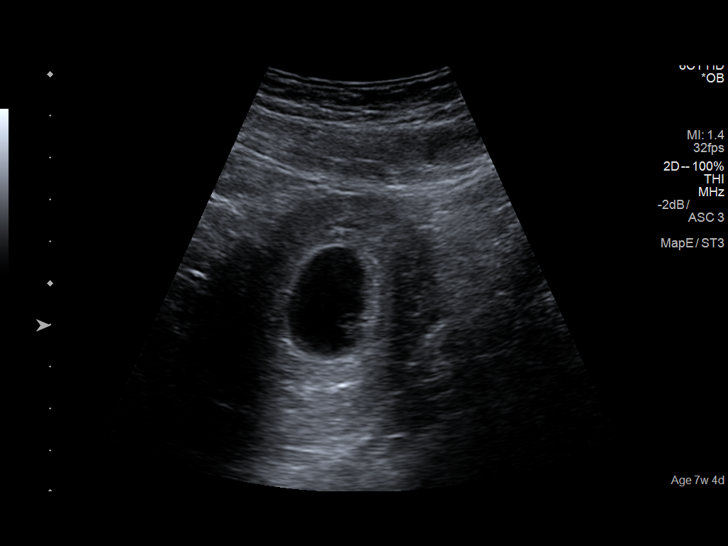
[im 6/47]
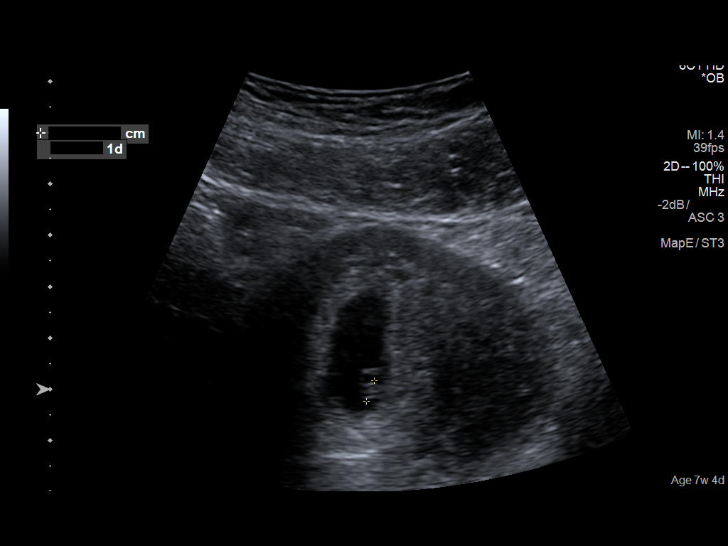
[im 9/47]
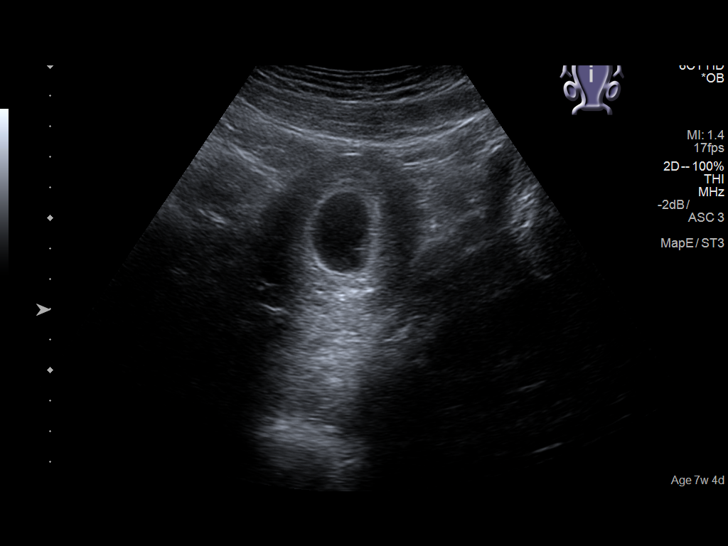
[im 12/47]
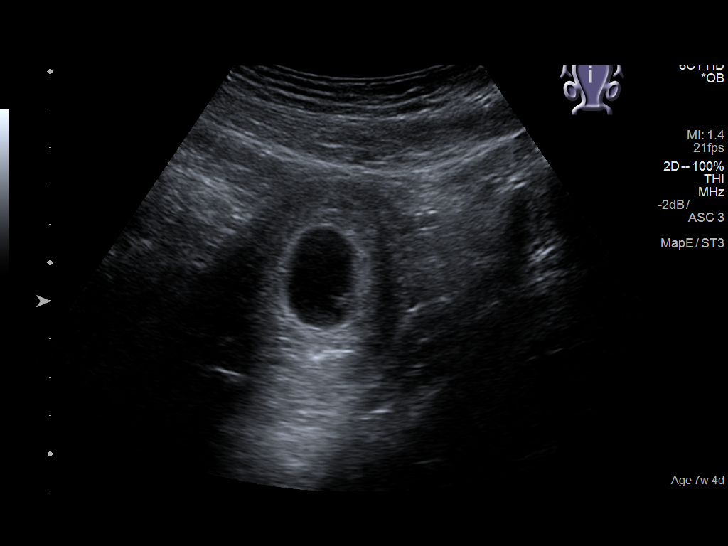
[im 16/47]
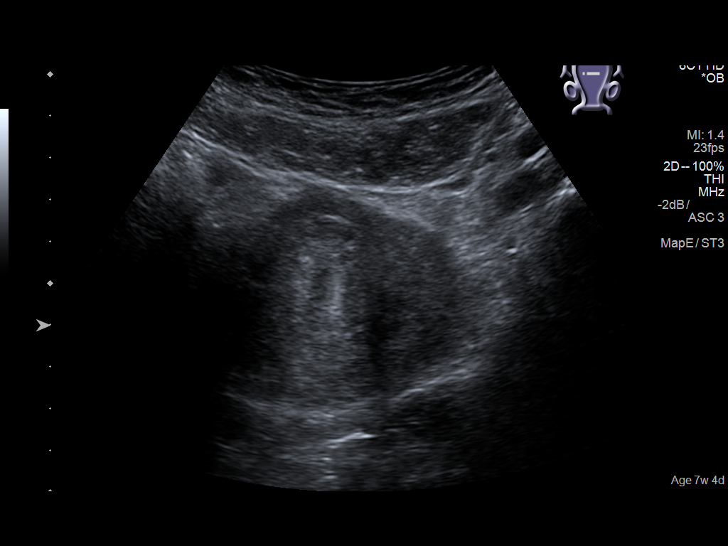
[im 19/47]
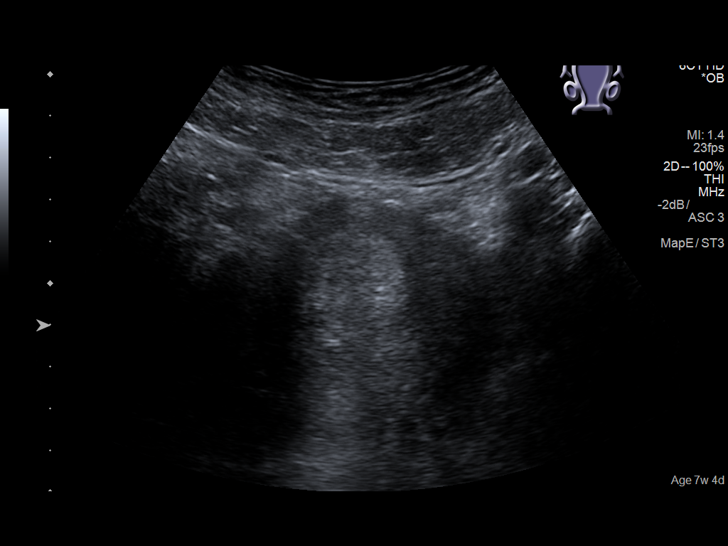
[im 24/47]
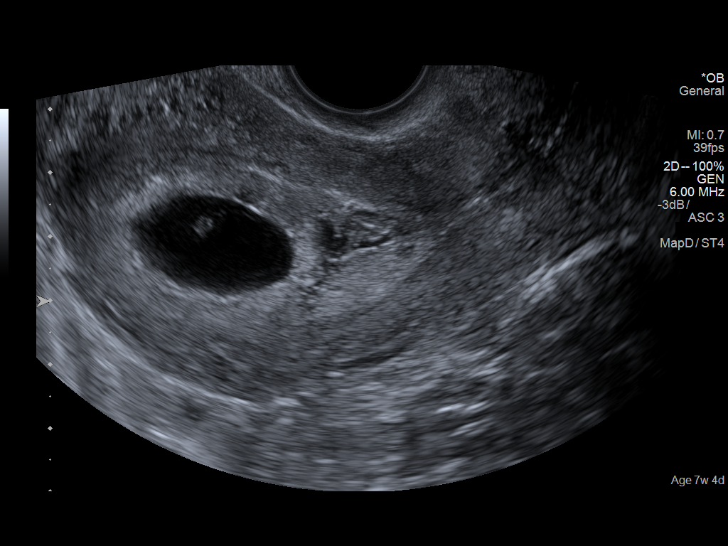
[im 28/47]
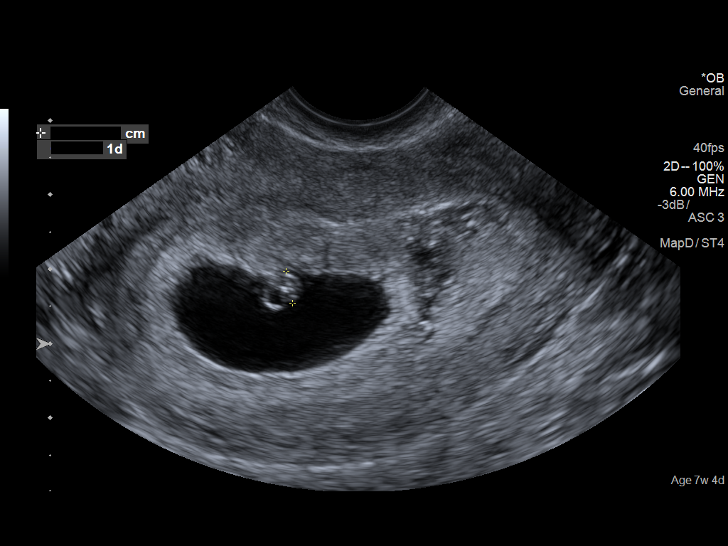
[im 31/47]
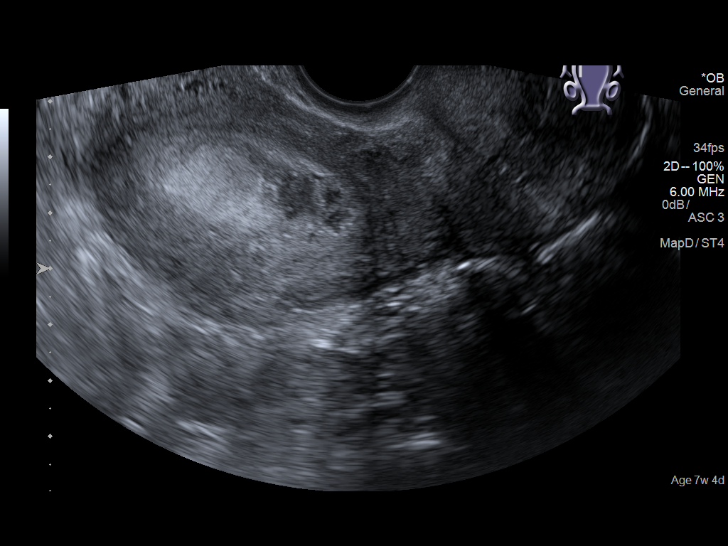
[im 35/47]
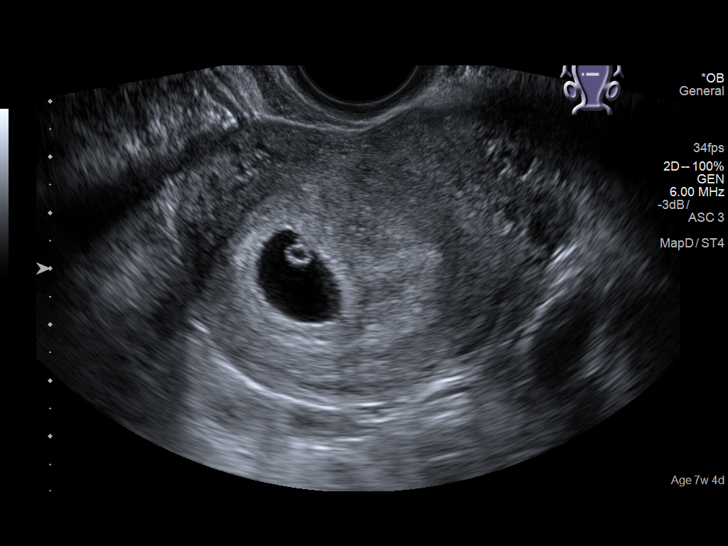
[im 38/47]
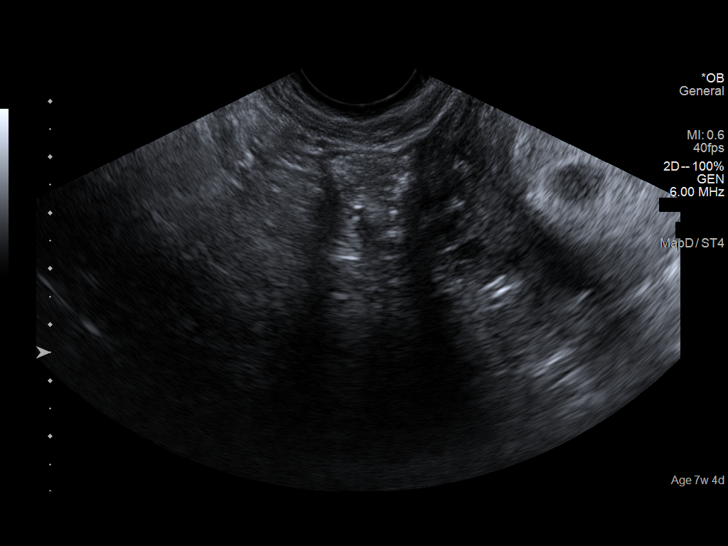
[im 41/47]
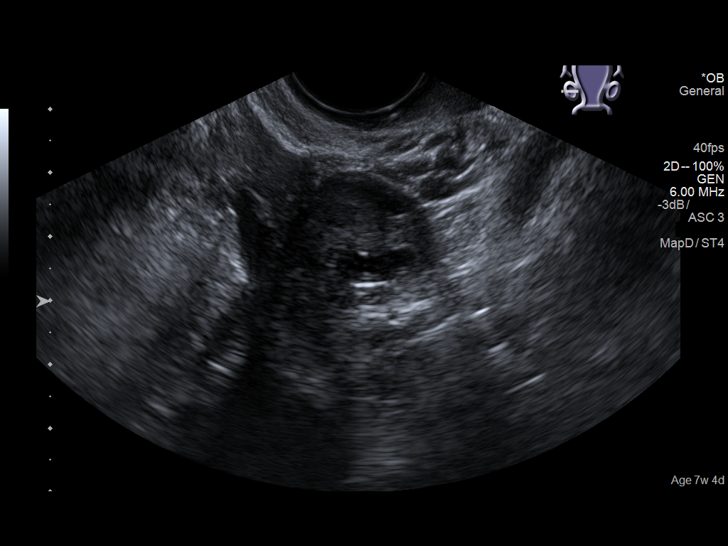
[im 45/47]
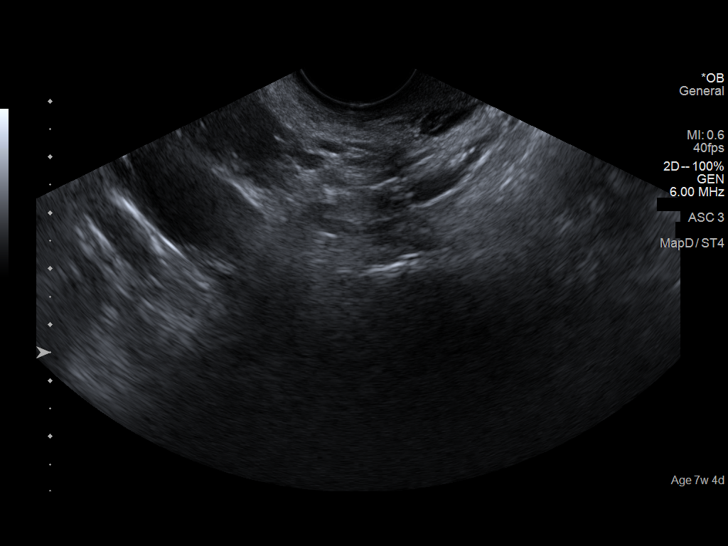

[13 of 28 positions shown; findings below may reference images not displayed]

FINDINGS: Intrauterine gestational sac: A single intrauterine pregnancy is
identified.

Yolk sac:  Yolk sac is present.

Embryo:  Fetal pole is identified.

Cardiac Activity: Fetal cardiac activity is observed.

Heart Rate: 115 bpm

CRL: 4.4 mm   6 w   1 d                  US EDC: 05/30/2018

Subchorionic hemorrhage: A small subchorionic hemorrhage is
identified inferiorly.

Maternal uterus/adnexae: Uterus is anteverted. No myometrial mass
lesions are identified. Small nabothian cysts in the cervix. The
right ovary is visualized and appears normal with normal follicular
changes seen. Left ovary is not definitely identified. No abnormal
adnexal masses are seen in images of the left adnexal region. Small
amount of free fluid in the pelvis.
IMPRESSION: Single intrauterine pregnancy. Estimated gestational age by
crown-rump length is 6 weeks 1 day. Small subchorionic hemorrhage.
